# Patient Record
Sex: Female | Born: 1995 | Race: Black or African American | Hispanic: No | Marital: Single | Smoking: Never smoker
Health system: Southern US, Community
[De-identification: ages and names within clinical notes are randomized; demographics above are authoritative.]

## PROBLEM LIST (undated history)

## (undated) DIAGNOSIS — E119 Type 2 diabetes mellitus without complications: Secondary | ICD-10-CM

## (undated) DIAGNOSIS — L409 Psoriasis, unspecified: Secondary | ICD-10-CM

## (undated) DIAGNOSIS — F32A Depression, unspecified: Secondary | ICD-10-CM

## (undated) HISTORY — PX: TONSILECTOMY, ADENOIDECTOMY, BILATERAL MYRINGOTOMY AND TUBES: SHX2538

## (undated) HISTORY — DX: Depression, unspecified: F32.A

---
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
## 2016-01-14 ENCOUNTER — Encounter (HOSPITAL_COMMUNITY): Payer: Self-pay | Admitting: *Deleted

## 2016-01-14 ENCOUNTER — Inpatient Hospital Stay (HOSPITAL_COMMUNITY)
Admission: AD | Admit: 2016-01-14 | Discharge: 2016-01-14 | Disposition: A | Discharge status: Home / Self Care | Payer: Medicaid Other | Source: Ambulatory Visit | Attending: Family Medicine | Admitting: Family Medicine

## 2016-01-14 DIAGNOSIS — N926 Irregular menstruation, unspecified: Secondary | ICD-10-CM | POA: Insufficient documentation

## 2016-01-14 LAB — URINALYSIS, ROUTINE W REFLEX MICROSCOPIC
Bilirubin Urine: NEGATIVE
Glucose, UA: NEGATIVE mg/dL
KETONES UR: NEGATIVE mg/dL
LEUKOCYTES UA: NEGATIVE
NITRITE: NEGATIVE
PROTEIN: NEGATIVE mg/dL
SPECIFIC GRAVITY, URINE: 1.025 (ref 1.005–1.030)
pH: 5.5 (ref 5.0–8.0)

## 2016-01-14 LAB — URINE MICROSCOPIC-ADD ON

## 2016-01-14 LAB — POCT PREGNANCY, URINE: PREG TEST UR: NEGATIVE

## 2016-01-14 NOTE — MAU Provider Note (Signed)
History     CSN: [id number]  Arrival date and time: 01/14/16 1950   First Provider Initiated Contact with Patient 01/14/16 2034      Chief Complaint  Patient presents with  . Menstrual Problem   Paula [name] is a 20 y.o. G0P0000 who presents today with irregular periods. She states that her periods have been irregular since 2014. She was started on birth control pills, but she did not take them. She states that they helped with the cramps, but did not help regulate her cycles. She denies any pain or bleeding today. She states that she has only had intercourse one time in her life when she was 77, and has not had intercourse since. She is not concerned about any STIs. She does not have a PCP. LMP she is unsure of the date. She states that she does not keep a calendar with her periods. She wants "to be checked for irregular periods."   Past Medical History:  Diagnosis Date  . Asthma     Past Surgical History:  Procedure Laterality Date  . GINGIVECTOMY  2010  . TONSILECTOMY, ADENOIDECTOMY, BILATERAL MYRINGOTOMY AND TUBES      Family History  Problem Relation Age of Onset  . Hypertension Maternal Grandmother     Social History  Substance Use Topics  . Smoking status: Never Smoker  . Smokeless tobacco: Never Used  . Alcohol use No    Allergies: No Known Allergies  Prescriptions Prior to Admission  Medication Sig Dispense Refill Last Dose  . carbamide peroxide (DEBROX) 6.5 % otic solution Place 5 drops into the right ear 2 (two) times daily. X 5 days 15 mL 1   . fluticasone (FLONASE) 50 MCG/ACT nasal spray Place 2 sprays into both nostrils daily. 16 g 0   . loratadine (CLARITIN) 10 MG tablet Take 1 tablet (10 mg total) by mouth daily. 30 tablet 0   . norgestimate-ethinyl estradiol (ORTHO-CYCLEN,SPRINTEC,PREVIFEM) 0.25-35 MG-MCG tablet Take 1 tablet by mouth daily. 1 Package 11     Review of Systems  Constitutional: Negative for chills and fever.  Gastrointestinal:  Positive for nausea ("a couple of weeks ago."). Negative for abdominal pain, constipation, diarrhea and vomiting.  Genitourinary: Negative for dysuria, frequency and urgency.   Physical Exam   Blood pressure 124/73, pulse 80, temperature 99.1 F (37.3 C), temperature source Oral, resp. rate 15, height 5\' 4"  (1.626 m), weight 185 lb (83.9 kg), last menstrual period 12/24/2015, SpO2 99 %.  Physical Exam  Nursing note and vitals reviewed. Constitutional: She appears well-developed and well-nourished. No distress.  HENT:  Head: Normocephalic.  Cardiovascular: Normal rate.   Respiratory: Effort normal.  GI: Soft. There is no tenderness. There is no rebound.  Neurological: She is alert.  Skin: Skin is warm and dry.  Psychiatric: She has a normal mood and affect.    MAU Course  Procedures  MDM D/W the patient at length that the only way to know what is happening with her periods it to keep a calendar of when she is having bleeding. Patient agreeable to keeping a calendar and then following up with GYN outpatient.   Assessment and Plan   1. Irregular menstrual cycle    DC home Comfort measures reviewed  Menstrual calendar In the absence of pain or bleeding today recommend outpatient follow up.  RX: none  Return to MAU as needed FU with OB as planned  Follow-up Information    [facility] .   Contact information:  [address] [place] [address] [phone]            Tawnya CrookHogan, Aiya Keach Donovan 01/14/2016, 8:37 PM

## 2016-01-14 NOTE — MAU Note (Signed)
Pt states  She wants to see if she can be tested for fibroids. States her periods are irregular and she has cramping with her periods.

## 2016-01-14 NOTE — Discharge Instructions (Signed)
° ° ° °  OBGYN providers [facility]    [facility]  [phone]     Phone: [phone]          [facility]                      [facility]  @[place]     Phone: [phone]  Phone: [phone]         [facility]     Phone: [phone]  Phone: [phone]           [facility] [place]                hone: [phone]  Phone: [phone]         [facility] [address] Francoise Ceo[facility]ard [facility]      Phone: [phone]  Phone: [phone]         [facility]                Phone: [phone]  [facility]   [address]Phone:[phone]    [facility] [place]          Phone: [phone] [facility]   Phone: [phone]

## 2016-03-03 ENCOUNTER — Encounter (HOSPITAL_COMMUNITY): Payer: Self-pay | Admitting: Emergency Medicine

## 2016-03-03 ENCOUNTER — Emergency Department (HOSPITAL_COMMUNITY)
Admission: EM | Admit: 2016-03-03 | Discharge: 2016-03-04 | Disposition: A | Discharge status: Home / Self Care | Payer: Medicaid Other | Attending: Emergency Medicine | Admitting: Emergency Medicine

## 2016-03-03 DIAGNOSIS — Y939 Activity, unspecified: Secondary | ICD-10-CM | POA: Insufficient documentation

## 2016-03-03 DIAGNOSIS — Y929 Unspecified place or not applicable: Secondary | ICD-10-CM | POA: Insufficient documentation

## 2016-03-03 DIAGNOSIS — H1132 Conjunctival hemorrhage, left eye: Secondary | ICD-10-CM | POA: Insufficient documentation

## 2016-03-03 DIAGNOSIS — S6992XA Unspecified injury of left wrist, hand and finger(s), initial encounter: Secondary | ICD-10-CM | POA: Diagnosis present

## 2016-03-03 DIAGNOSIS — Y999 Unspecified external cause status: Secondary | ICD-10-CM | POA: Insufficient documentation

## 2016-03-03 DIAGNOSIS — Z79899 Other long term (current) drug therapy: Secondary | ICD-10-CM | POA: Insufficient documentation

## 2016-03-03 DIAGNOSIS — Z23 Encounter for immunization: Secondary | ICD-10-CM | POA: Diagnosis not present

## 2016-03-03 DIAGNOSIS — J45909 Unspecified asthma, uncomplicated: Secondary | ICD-10-CM | POA: Insufficient documentation

## 2016-03-03 MED ORDER — FLUORESCEIN SODIUM 1 MG OP STRP
ORAL_STRIP | OPHTHALMIC | Status: AC
  Filled 2016-03-03: qty 1

## 2016-03-03 MED ORDER — TETRACAINE HCL 0.5 % OP SOLN
OPHTHALMIC | Status: AC
  Filled 2016-03-03: qty 4
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
## 2016-03-04 MED ORDER — TETANUS-DIPHTH-ACELL PERTUSSIS 5-2.5-18.5 LF-MCG/0.5 IM SUSP
0.5000 mL | Freq: Once | INTRAMUSCULAR | Status: AC
  Administered 2016-03-04: 0.5 mL via INTRAMUSCULAR
  Filled 2016-03-04: qty 0.5

## 2016-03-04 MED ORDER — FLUORESCEIN SODIUM 1 MG OP STRP
1.0000 | ORAL_STRIP | Freq: Once | OPHTHALMIC | Status: AC
  Administered 2016-03-04: 1 via OPHTHALMIC
  Filled 2016-03-04: qty 1

## 2016-03-04 MED ORDER — PROPARACAINE HCL 0.5 % OP SOLN
1.0000 [drp] | Freq: Once | OPHTHALMIC | Status: AC
  Administered 2016-03-04: 1 [drp] via OPHTHALMIC
  Filled 2016-03-04: qty 15

## 2016-03-04 MED ORDER — IBUPROFEN 600 MG PO TABS: 600.0000 mg | ORAL_TABLET | Freq: Four times a day (QID) | ORAL | 0 refills | Status: DC | PRN

## 2016-03-04 MED ORDER — BACITRACIN ZINC 500 UNIT/GM EX OINT
TOPICAL_OINTMENT | Freq: Two times a day (BID) | CUTANEOUS | Status: DC
  Administered 2016-03-04: 1 via TOPICAL
  Filled 2016-03-04: qty 0.9

## 2016-03-04 NOTE — ED Notes (Signed)
Patient was alert, oriented and stable upon discharge. RN went over AVS and patient had no further questions.  

## 2016-03-21 ENCOUNTER — Ambulatory Visit: Payer: Medicaid Other | Admitting: Obstetrics & Gynecology

## 2016-03-25 ENCOUNTER — Encounter (HOSPITAL_COMMUNITY): Payer: Self-pay | Admitting: Emergency Medicine

## 2016-03-25 ENCOUNTER — Emergency Department (HOSPITAL_COMMUNITY): Payer: Medicaid Other

## 2016-03-25 ENCOUNTER — Emergency Department (HOSPITAL_COMMUNITY)
Admission: EM | Admit: 2016-03-25 | Discharge: 2016-03-25 | Disposition: A | Discharge status: Home / Self Care | Payer: Medicaid Other | Attending: Emergency Medicine | Admitting: Emergency Medicine

## 2016-03-25 DIAGNOSIS — Y92410 Unspecified street and highway as the place of occurrence of the external cause: Secondary | ICD-10-CM | POA: Insufficient documentation

## 2016-03-25 DIAGNOSIS — Y999 Unspecified external cause status: Secondary | ICD-10-CM | POA: Insufficient documentation

## 2016-03-25 DIAGNOSIS — Y939 Activity, unspecified: Secondary | ICD-10-CM | POA: Insufficient documentation

## 2016-03-25 DIAGNOSIS — M25521 Pain in right elbow: Secondary | ICD-10-CM | POA: Insufficient documentation

## 2016-03-25 DIAGNOSIS — J45909 Unspecified asthma, uncomplicated: Secondary | ICD-10-CM | POA: Insufficient documentation

## 2016-03-25 DIAGNOSIS — S3992XA Unspecified injury of lower back, initial encounter: Secondary | ICD-10-CM | POA: Insufficient documentation

## 2016-03-25 LAB — POC URINE PREG, ED: Preg Test, Ur: NEGATIVE

## 2016-03-25 MED ORDER — IBUPROFEN 400 MG PO TABS
800.0000 mg | ORAL_TABLET | Freq: Once | ORAL | Status: AC
  Administered 2016-03-25: 800 mg via ORAL
  Filled 2016-03-25: qty 2

## 2016-03-25 NOTE — ED Notes (Signed)
Pt kept asking to wait in lobby and receive results there. Pt informed she would need to wait in her room. Pt left the room and hasn't been back for 30 minutes.
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
## 2016-06-03 ENCOUNTER — Ambulatory Visit (INDEPENDENT_AMBULATORY_CARE_PROVIDER_SITE_OTHER): Payer: Medicaid Other | Admitting: Obstetrics and Gynecology

## 2016-06-03 ENCOUNTER — Encounter: Payer: Self-pay | Admitting: Obstetrics and Gynecology

## 2016-06-03 VITALS — BP 103/68 | HR 73 | Wt 185.1 lb

## 2016-06-03 DIAGNOSIS — N920 Excessive and frequent menstruation with regular cycle: Secondary | ICD-10-CM

## 2016-06-03 LAB — CBC
HEMATOCRIT: 36.2 % (ref 35.0–45.0)
HEMOGLOBIN: 12 g/dL (ref 11.7–15.5)
MCH: 27.8 pg (ref 27.0–33.0)
MCHC: 33.1 g/dL (ref 32.0–36.0)
MCV: 84 fL (ref 80.0–100.0)
MPV: 9.5 fL (ref 7.5–12.5)
Platelets: 303 10*3/uL (ref 140–400)
RBC: 4.31 MIL/uL (ref 3.80–5.10)
RDW: 15.4 % — ABNORMAL HIGH (ref 11.0–15.0)
WBC: 6.5 10*3/uL (ref 3.8–10.8)

## 2016-06-03 MED ORDER — NORETHIN ACE-ETH ESTRAD-FE 1-20 MG-MCG(24) PO TABS: 1.0000 | ORAL_TABLET | Freq: Every day | ORAL | 11 refills | Status: DC

## 2016-06-03 NOTE — Patient Instructions (Signed)
Oral Contraception Information Oral contraceptive pills (OCPs) are medicines taken to prevent pregnancy. OCPs work by preventing the ovaries from releasing eggs. The hormones in OCPs also cause the cervical mucus to thicken, preventing the sperm from entering the uterus. The hormones also cause the uterine lining to become thin, not allowing a fertilized egg to attach to the inside of the uterus. OCPs are highly effective when taken exactly as prescribed. However, OCPs do not prevent sexually transmitted diseases (STDs). Safe sex practices, such as using condoms along with the pill, can help prevent STDs.  Before taking the pill, you may have a physical exam and Pap test. Your health care provider may order blood tests. The health care provider will make sure you are a good candidate for oral contraception. Discuss with your health care provider the possible side effects of the OCP you may be prescribed. When starting an OCP, it can take 2 to 3 months for the body to adjust to the changes in hormone levels in your body.  TYPES OF ORAL CONTRACEPTION  The combination pill-This pill contains estrogen and progestin (synthetic progesterone) hormones. The combination pill comes in 21-day, 28-day, or 91-day packs. Some types of combination pills are meant to be taken continuously (365-day pills). With 21-day packs, you do not take pills for 7 days after the last pill. With 28-day packs, the pill is taken every day. The last 7 pills are without hormones. Certain types of pills have more than 21 hormone-containing pills. With 91-day packs, the first 84 pills contain both hormones, and the last 7 pills contain no hormones or contain estrogen only.  The minipill-This pill contains the progesterone hormone only. The pill is taken every day continuously. It is very important to take the pill at the same time each day. The minipill comes in packs of 28 pills. All 28 pills contain the hormone.  ADVANTAGES OF ORAL  CONTRACEPTIVE PILLS  Decreases premenstrual symptoms.   Treats menstrual period cramps.   Regulates the menstrual cycle.   Decreases a heavy menstrual flow.   May treatacne, depending on the type of pill.   Treats abnormal uterine bleeding.   Treats polycystic ovarian syndrome.   Treats endometriosis.   Can be used as emergency contraception.  THINGS THAT CAN MAKE ORAL CONTRACEPTIVE PILLS LESS EFFECTIVE OCPs can be less effective if:   You forget to take the pill at the same time every day.   You have a stomach or intestinal disease that lessens the absorption of the pill.   You take OCPs with other medicines that make OCPs less effective, such as antibiotics, certain HIV medicines, and some seizure medicines.   You take expired OCPs.   You forget to restart the pill on day 7, when using the packs of 21 pills.  RISKS ASSOCIATED WITH ORAL CONTRACEPTIVE PILLS  Oral contraceptive pills can sometimes cause side effects, such as:  Headache.  Nausea.  Breast tenderness.  Irregular bleeding or spotting. Combination pills are also associated with a small increased risk of:  Blood clots.  Heart attack.  Stroke. This information is not intended to replace advice given to you by your health care provider. Make sure you discuss any questions you have with your health care provider. Document Released: 07/12/2002 Document Revised: 08/13/2015 Document Reviewed: 10/10/2012 Elsevier Interactive Patient Education  2017 Elsevier Inc.  

## 2016-06-03 NOTE — Progress Notes (Signed)
   Subjective:    Patient ID: Paula [name], female    DOB: [birth year], 21 y.o.   MRN: [id number]  Paula DunningsZenkiah M Mifflin is a 21 y.o. G0P0 presenting with heavy menstrual periods. Menarche age 21 and flow has always been heavy. She states she bleeds heavily and passes clots for the first few days of her 7 day flow. Interval is regular at q4 weeks. LMP 05/18/2016. No intermenstrual bleeding or spotting.  In the past she has used Sprintec--most recently in 2015, however she felt it caused menses to be heavier and did not continue. She is concerned (after checking the Internet) that she has an endocrine problem affecting her menstrual flow because she has noticed some hair on her chin and face. She is not sexually active and states she had intercourse only once 3 years ago. Denies irritative vaginal discharge. Denies fatigue or orthostatic symptoms.      Review of Systems  Constitutional: Negative for fatigue and fever.  Gastrointestinal: Negative for nausea and vomiting.  Genitourinary: Negative for dysuria.  Neurological: Negative for headaches.  Psychiatric/Behavioral: The patient is not nervous/anxious.        Objective:   Physical Exam  Constitutional: She is oriented to person, place, and time. She appears well-developed and well-nourished. No distress.  HENT:  Head: Normocephalic.  Neck: Normal range of motion.  Cardiovascular: Normal rate.   Pulmonary/Chest: Effort normal.  Abdominal: Soft. There is no tenderness.  Genitourinary:  Genitourinary Comments: Did not tolerate speculum exam. One finger digital exam revealed no CMT. Uterus poorly outlined due to tensed muscles, but no tenderness or masses noted in uterus or adnexae  Musculoskeletal: Normal range of motion.  Neurological: She is alert and oriented to person, place, and time.  Skin: Skin is warm and dry.  No apparent hirsutism  Nursing note and vitals reviewed.  CBC sent       Assessment & Plan:   1. Menorrhagia with  regular cycle    Rx Loestrin with instructions Home with reassurance.  Follow up 3 months

## 2017-02-05 ENCOUNTER — Encounter: Payer: Self-pay | Admitting: Emergency Medicine

## 2017-02-05 ENCOUNTER — Emergency Department (HOSPITAL_COMMUNITY)
Admission: EM | Admit: 2017-02-05 | Discharge: 2017-02-05 | Disposition: A | Discharge status: Home / Self Care | Payer: Medicaid Other | Attending: Emergency Medicine | Admitting: Emergency Medicine

## 2017-02-05 DIAGNOSIS — Z79899 Other long term (current) drug therapy: Secondary | ICD-10-CM | POA: Insufficient documentation

## 2017-02-05 DIAGNOSIS — R51 Headache: Secondary | ICD-10-CM | POA: Diagnosis present

## 2017-02-05 DIAGNOSIS — N898 Other specified noninflammatory disorders of vagina: Secondary | ICD-10-CM | POA: Diagnosis not present

## 2017-02-05 DIAGNOSIS — R0981 Nasal congestion: Secondary | ICD-10-CM | POA: Diagnosis not present

## 2017-02-05 LAB — WET PREP, GENITAL
CLUE CELLS WET PREP: NONE SEEN
SPERM: NONE SEEN
TRICH WET PREP: NONE SEEN
YEAST WET PREP: NONE SEEN

## 2017-02-05 MED ORDER — SALINE SPRAY 0.65 % NA SOLN: 1.0000 | NASAL | 0 refills | Status: DC | PRN

## 2017-02-05 MED ORDER — LIDOCAINE HCL 1 % IJ SOLN
INTRAMUSCULAR | Status: AC
  Administered 2017-02-05: 1.5 mL
  Filled 2017-02-05: qty 20

## 2017-02-05 MED ORDER — AZITHROMYCIN 250 MG PO TABS
1000.0000 mg | ORAL_TABLET | Freq: Once | ORAL | Status: AC
  Administered 2017-02-05: 1000 mg via ORAL
  Filled 2017-02-05: qty 4

## 2017-02-05 MED ORDER — CEFTRIAXONE SODIUM 250 MG IJ SOLR
250.0000 mg | Freq: Once | INTRAMUSCULAR | Status: AC
  Administered 2017-02-05: 250 mg via INTRAMUSCULAR
  Filled 2017-02-05: qty 250

## 2017-02-05 MED ORDER — PSEUDOEPHEDRINE HCL 30 MG PO TABS: 30.0000 mg | ORAL_TABLET | Freq: Four times a day (QID) | ORAL | 0 refills | Status: DC | PRN

## 2017-02-05 NOTE — Discharge Instructions (Signed)
If any of your test come back positive someone will call you.  Take the medication as directed for your sinus congestion. Follow up with the health department.  Return here as needed.

## 2017-02-05 NOTE — ED Triage Notes (Signed)
Pt c/o facial and sinus pain. Pt also c/o vaginal discharge.

## 2017-02-05 NOTE — ED Notes (Signed)
Patient called to be roomed, no answer. Patient came back to registration, went to call patient again, pt was not in lobby or outside

## 2017-02-05 NOTE — ED Notes (Signed)
Bed: WTR7 Expected date:  Expected time:  Means of arrival:  Comments: 

## 2017-02-05 NOTE — ED Provider Notes (Signed)
[facility]-EMERGENCY DEPT Provider Note   CSN: [id number] Arrival date & time: 02/05/17  1014     History   Chief Complaint Chief Complaint  Patient presents with  . Facial Pain  . Vaginal Discharge    HPI Paula [name] is a 21 y.o. female who presents to the ED with sinus pressure, facial pain, cough and congeston and feeling like she wants to spit all the time. Denies sore throat or difficulty swallowing.  The symptoms started 3 days ago and come and go.   Patient also c/o vaginal d/c that started a few weeks ago and a lesion to the left upper thigh. Patient reports she is taking OC's but has missed pills. Last sexual intercourse 10 days ago, unprotected. No hx of STD's. Patient concerned that she may have an STI and would like to be treated just in case.  The history is provided by the patient. No language interpreter was used.  Vaginal Discharge   This is a new problem. The current episode started more than 1 week ago. The problem has not changed since onset.Pregnant now: unsure. Associated symptoms include frequency. Pertinent negatives include no fever, no abdominal pain, no diarrhea, no nausea, no vomiting and no dysuria.    Past Medical History:  Diagnosis Date  . Asthma     Patient Active Problem List   Diagnosis Date Noted  . Menometrorrhagia 01/19/2014  . General counseling for prescription of oral contraceptives 01/19/2014  . MRSA (methicillin resistant staph aureus) culture positive 09/03/2011  . Candida albicans infection 09/03/2011    Past Surgical History:  Procedure Laterality Date  . GINGIVECTOMY  2010  . TONSILECTOMY, ADENOIDECTOMY, BILATERAL MYRINGOTOMY AND TUBES      OB History    Gravida Para Term Preterm AB Living   0 0 0 0 0 0   SAB TAB Ectopic Multiple Live Births   0 0 0 0         Home Medications    Prior to Admission medications   Medication Sig Start Date End Date Taking? Authorizing Provider  griseofulvin (GRIS-PEG) 250 MG tablet Take  500 mg by mouth daily. 01/06/17  Yes [provider]  ibuprofen (ADVIL,MOTRIN) 200 MG tablet Take 400 mg by mouth every 6 (six) hours as needed for moderate pain.   Yes [provider]  loratadine (CLARITIN) 10 MG tablet Take 1 tablet (10 mg total) by mouth daily. 12/17/14  Yes Street, [place], PA-C  Norethindrone Acetate-Ethinyl Estrad-FE (LOESTRIN 24 FE) 1-20 MG-MCG(24) tablet Take 1 tablet by mouth daily. 06/03/16  Yes Poe, Deirdre C, CNM  norgestimate-ethinyl estradiol (ORTHO-CYCLEN,SPRINTEC,PREVIFEM) 0.25-35 MG-MCG tablet Take 1 tablet by mouth daily. Patient not taking: Reported on 06/03/2016 01/19/14   Glyn Ade, Scot Jun, PA-C  pseudoephedrine (SUDAFED) 30 MG tablet Take 1 tablet (30 mg total) by mouth every 6 (six) hours as needed for congestion. 02/05/17   Janne Napoleon, NP  sodium chloride (OCEAN) 0.65 % SOLN nasal spray Place 1 spray into both nostrils as needed for congestion. 02/05/17   Janne Napoleon, NP    Family History Family History  Problem Relation Age of Onset  . Hypertension Maternal Grandmother     Social History Social History  Substance Use Topics  . Smoking status: Never Smoker  . Smokeless tobacco: Never Used  . Alcohol use No     Allergies   Patient has no known allergies.   Review of Systems Review of Systems  Constitutional: Negative for chills and fever.  HENT: Positive for congestion, sinus pain, sinus pressure, sneezing and sore throat. Negative for ear pain, facial swelling and trouble swallowing.   Eyes: Negative for discharge, redness and itching.  Respiratory: Negative for chest tightness and shortness of breath.   Cardiovascular: Negative for chest pain and leg swelling.  Gastrointestinal: Negative for abdominal pain, diarrhea, nausea and vomiting.  Genitourinary: Positive for frequency and vaginal discharge. Negative for dysuria, pelvic pain and vaginal pain.  Musculoskeletal: Negative for myalgias and neck pain.  Skin:  Positive for rash.  Neurological: Positive for headaches. Negative for syncope, weakness and light-headedness.  Psychiatric/Behavioral: Negative for confusion. The patient is not nervous/anxious.      Physical Exam Updated Vital Signs BP 122/74   Pulse (!) 118   Temp 98.7 F (37.1 C) (Oral)   Resp 16   Ht  (1.626 m)   Wt 81.6 kg (180 lb)   LMP 01/11/2017 (Approximate)   SpO2 100%   BMI 30.90 kg/m   Physical Exam  Constitutional: She appears well-developed and well-nourished. No distress.  HENT:  Head: Normocephalic and atraumatic.  Right Ear: Tympanic membrane normal.  Left Ear: Tympanic membrane normal.  Nose: Rhinorrhea present. Right sinus exhibits maxillary sinus tenderness. Left sinus exhibits maxillary sinus tenderness.  Mouth/Throat: Mucous membranes are normal. No oral lesions. No trismus in the jaw. Normal dentition. No uvula swelling or dental caries. No posterior oropharyngeal edema or posterior oropharyngeal erythema.  Face if symmetric, no decreased sensation.   Eyes: Pupils are equal, round, and reactive to light. Conjunctivae and EOM are normal.  Neck: Normal range of motion. Neck supple.  Cardiovascular: Normal rate and regular rhythm.   Pulmonary/Chest: Effort normal and breath sounds normal. She exhibits no tenderness.  Abdominal: Soft. Bowel sounds are normal. There is no tenderness.  Genitourinary:  Genitourinary Comments: External genitalia without lesions, watery gray d/c vaginal vault. Cultures collected for GC and Chlamydia.   Musculoskeletal: Normal range of motion.  Lymphadenopathy:    She has no cervical adenopathy.  Neurological: She is alert.  Skin: Skin is warm and dry.  2 areas of folliculitis to pubic area and one to left inner thigh.   Psychiatric: She has a normal mood and affect. Her behavior is normal.  Nursing note and vitals reviewed.    ED Treatments / Results  Labs (all labs ordered are listed, but only abnormal results are  displayed) Labs Reviewed  WET PREP, GENITAL - Abnormal; Notable for the following:       Result Value   WBC, Wet Prep HPF POC MODERATE (*)    All other components within normal limits  RPR  HIV ANTIBODY (ROUTINE TESTING)  GC/CHLAMYDIA PROBE AMP ([place]) NOT AT [facility]    Radiology No results found.  Procedures Procedures (including critical care time)  Medications Ordered in ED Medications  cefTRIAXone (ROCEPHIN) injection 250 mg (250 mg Intramuscular Given 02/05/17 1402)  azithromycin (ZITHROMAX) tablet 1,000 mg (1,000 mg Oral Given 02/05/17 1401)  lidocaine (XYLOCAINE) 1 % (with pres) injection (1.5 mLs  Given 02/05/17 1406)     Initial Impression / Assessment and Plan / ED Course  I have reviewed the triage vital signs and the nursing notes. Pt presents with concerns for possible STD.  Pt understands that they have GC/Chlamydia cultures pending and that they will need to inform all sexual partners if results return positive. Pt has been treated prophylactically with azithromycin and Rocephin due to pts history, pelvic exam, and wet prep with increased WBCs.  Pt not concerning for PID because hemodynamically stable and no cervical motion tenderness on pelvic exam.  Patient to be discharged with instructions to follow up with [facility]. Discussed importance of using protection when sexually active.   Patient also with sinus pressure and URI symptoms. Will treat with Sudafed and Normal saline nose spray. Patient to f/u with her PCP. Return precautions discussed.   Final Clinical Impressions(s) / ED Diagnoses   Final diagnoses:  Vaginal discharge  Sinus congestion    New Prescriptions Discharge Medication List as of 02/05/2017  3:59 PM    START taking these medications   Details  pseudoephedrine (SUDAFED) 30 MG tablet Take 1 tablet (30 mg total) by mouth every 6 (six) hours as needed for congestion., Starting Thu 02/05/2017, Print    sodium chloride (OCEAN) 0.65 % SOLN nasal spray  Place 1 spray into both nostrils as needed for congestion., Starting Thu 02/05/2017, [address] [place], NP 02/05/17 Herminio Commons    Benjiman Core, MD 02/06/17 [phone]

## 2017-02-06 LAB — GC/CHLAMYDIA PROBE AMP (~~LOC~~) NOT AT ARMC
Chlamydia: NEGATIVE
NEISSERIA GONORRHEA: NEGATIVE

## 2017-02-06 LAB — RPR: RPR Ser Ql: NONREACTIVE

## 2017-02-06 LAB — HIV ANTIBODY (ROUTINE TESTING W REFLEX): HIV Screen 4th Generation wRfx: NONREACTIVE

## 2017-05-25 ENCOUNTER — Encounter (HOSPITAL_COMMUNITY): Payer: Self-pay

## 2017-05-25 ENCOUNTER — Inpatient Hospital Stay (HOSPITAL_COMMUNITY)
Admission: AD | Admit: 2017-05-25 | Discharge: 2017-05-25 | Disposition: A | Discharge status: Home / Self Care | Payer: Medicaid Other | Source: Ambulatory Visit | Attending: Obstetrics and Gynecology | Admitting: Obstetrics and Gynecology

## 2017-05-25 ENCOUNTER — Emergency Department (HOSPITAL_COMMUNITY)
Admission: EM | Admit: 2017-05-25 | Discharge: 2017-05-25 | Discharge status: Against Medical Advice | Payer: No Typology Code available for payment source

## 2017-05-25 DIAGNOSIS — J45909 Unspecified asthma, uncomplicated: Secondary | ICD-10-CM | POA: Insufficient documentation

## 2017-05-25 DIAGNOSIS — K117 Disturbances of salivary secretion: Secondary | ICD-10-CM | POA: Diagnosis not present

## 2017-05-25 DIAGNOSIS — K119 Disease of salivary gland, unspecified: Secondary | ICD-10-CM | POA: Insufficient documentation

## 2017-05-25 DIAGNOSIS — R300 Dysuria: Secondary | ICD-10-CM | POA: Insufficient documentation

## 2017-05-25 DIAGNOSIS — E860 Dehydration: Secondary | ICD-10-CM | POA: Insufficient documentation

## 2017-05-25 LAB — URINALYSIS, ROUTINE W REFLEX MICROSCOPIC
Bilirubin Urine: NEGATIVE
Glucose, UA: NEGATIVE mg/dL
Hgb urine dipstick: NEGATIVE
Ketones, ur: NEGATIVE mg/dL
Leukocytes, UA: NEGATIVE
Nitrite: NEGATIVE
Protein, ur: NEGATIVE mg/dL
Specific Gravity, Urine: 1.026 (ref 1.005–1.030)
pH: 6 (ref 5.0–8.0)

## 2017-05-25 LAB — POCT PREGNANCY, URINE: PREG TEST UR: NEGATIVE

## 2017-05-25 NOTE — MAU Provider Note (Signed)
Chief Complaint: Dehydration and Dysuria   First Provider Initiated Contact with Patient 05/25/17 2217      SUBJECTIVE HPI: Paula [name] is a 22 y.o. G0P0000 not currently pregnant who presents to maternity admissions reporting dehydration and dysuria. Pt was at [facility] but left prior to being seen d/t the wait being too long. She complains of increased urination for the past two days. She reports burning with urination one time during these two days. She states she is dehydration and has looked it up on google because "she can not spit and if she wanted to spit on someone she couldn't". She denies vaginal discharge or bleeding. She denies vaginal itching/burning, h/a, dizziness, n/v, or fever/chills.      Past Medical History:  Diagnosis Date  . Asthma    Past Surgical History:  Procedure Laterality Date  . GINGIVECTOMY  2010  . TONSILECTOMY, ADENOIDECTOMY, BILATERAL MYRINGOTOMY AND TUBES     Social History   Socioeconomic History  . Marital status: Single    Spouse name: Not on file  . Number of children: Not on file  . Years of education: Not on file  . Highest education level: Not on file  Social Needs  . Financial resource strain: Not on file  . Food insecurity - worry: Not on file  . Food insecurity - inability: Not on file  . Transportation needs - medical: Not on file  . Transportation needs - non-medical: Not on file  Occupational History  . Not on file  Tobacco Use  . Smoking status: Never Smoker  . Smokeless tobacco: Never Used  Substance and Sexual Activity  . Alcohol use: No  . Drug use: No  . Sexual activity: No  Other Topics Concern  . Not on file  Social History Narrative  . Not on file   No current facility-administered medications on file prior to encounter.    Current Outpatient Medications on File Prior to Encounter  Medication Sig Dispense Refill  . griseofulvin (GRIS-PEG) 250 MG tablet Take 500 mg by mouth daily.  0  . ibuprofen  (ADVIL,MOTRIN) 200 MG tablet Take 400 mg by mouth every 6 (six) hours as needed for moderate pain.    Marland Kitchen loratadine (CLARITIN) 10 MG tablet Take 1 tablet (10 mg total) by mouth daily. 30 tablet 0  . Norethindrone Acetate-Ethinyl Estrad-FE (LOESTRIN 24 FE) 1-20 MG-MCG(24) tablet Take 1 tablet by mouth daily. 1 Package 11  . norgestimate-ethinyl estradiol (ORTHO-CYCLEN,SPRINTEC,PREVIFEM) 0.25-35 MG-MCG tablet Take 1 tablet by mouth daily. (Patient not taking: Reported on 06/03/2016) 1 Package 11  . pseudoephedrine (SUDAFED) 30 MG tablet Take 1 tablet (30 mg total) by mouth every 6 (six) hours as needed for congestion. 20 tablet 0  . sodium chloride (OCEAN) 0.65 % SOLN nasal spray Place 1 spray into both nostrils as needed for congestion. 30 mL 0   No Known Allergies  ROS:  Review of Systems  Constitutional: Negative.   Respiratory: Negative.   Cardiovascular: Negative.   Gastrointestinal: Negative.   Genitourinary: Positive for dysuria and frequency. Negative for decreased urine volume, difficulty urinating, flank pain, hematuria, pelvic pain, urgency, vaginal bleeding, vaginal discharge and vaginal pain.  Musculoskeletal: Negative.   Neurological: Negative.   Psychiatric/Behavioral: Negative.    I have reviewed patient's Past Medical Hx, Surgical Hx, Family Hx, Social Hx, medications and allergies.   Physical Exam   Patient Vitals for the past 24 hrs:  BP Temp Temp src Pulse Resp Height  05/25/17 2207 118/81  98.8 F (37.1 C) Oral 88 16 5\' 3"  (1.6 m)   Constitutional: Well-developed, well-nourished female in no acute distress.  Cardiovascular: normal rate Respiratory: normal effort GI: Abd soft, non-tender. Pos BS x 4 MS: Extremities nontender, no edema, normal ROM Neurologic: Alert and oriented x 4.  GU: Neg CVAT. Pelvic exam: deferred   LAB RESULTS Results for orders placed or performed during the hospital encounter of 05/25/17 (from the past 24 hour(s))  Urinalysis, Routine w  reflex microscopic     Status: None   Collection Time: 05/25/17  9:57 PM  Result Value Ref Range   Color, Urine YELLOW YELLOW   APPearance CLEAR CLEAR   Specific Gravity, Urine 1.026 1.005 - 1.030   pH 6.0 5.0 - 8.0   Glucose, UA NEGATIVE NEGATIVE mg/dL   Hgb urine dipstick NEGATIVE NEGATIVE   Bilirubin Urine NEGATIVE NEGATIVE   Ketones, ur NEGATIVE NEGATIVE mg/dL   Protein, ur NEGATIVE NEGATIVE mg/dL   Nitrite NEGATIVE NEGATIVE   Leukocytes, UA NEGATIVE NEGATIVE  Pregnancy, urine POC     Status: None   Collection Time: 05/25/17 10:16 PM  Result Value Ref Range   Preg Test, Ur NEGATIVE NEGATIVE    MAU Management/MDM: Orders Placed This Encounter  Procedures  . Urinalysis, Routine w reflex microscopic  . Pregnancy, urine POC   UA-neg, no evidence of UTI  UPT- neg  Pt discharged with instructions to seek care at PCP or urgent care if symptoms do not resolve by the end of the week.   ASSESSMENT 1. Saliva decreased   2.      Dysuria   PLAN Discharge home Follow up at PCP or urgent care is symptoms worsen and do not resolve    Allergies as of 05/25/2017   No Known Allergies     Medication List    TAKE these medications   griseofulvin 250 MG tablet Commonly known as:  GRIS-PEG Take 500 mg by mouth daily.   ibuprofen 200 MG tablet Commonly known as:  ADVIL,MOTRIN Take 400 mg by mouth every 6 (six) hours as needed for moderate pain.   loratadine 10 MG tablet Commonly known as:  CLARITIN Take 1 tablet (10 mg total) by mouth daily.   Norethindrone Acetate-Ethinyl Estrad-FE 1-20 MG-MCG(24) tablet Commonly known as:  LOESTRIN 24 FE Take 1 tablet by mouth daily.   norgestimate-ethinyl estradiol 0.25-35 MG-MCG tablet Commonly known as:  ORTHO-CYCLEN,SPRINTEC,PREVIFEM Take 1 tablet by mouth daily.   pseudoephedrine 30 MG tablet Commonly known as:  SUDAFED Take 1 tablet (30 mg total) by mouth every 6 (six) hours as needed for congestion.   sodium chloride 0.65 %  Soln nasal spray Commonly known as:  OCEAN Place 1 spray into both nostrils as needed for congestion.        Steward DroneVeronica Donasia Wimes  Certified Nurse-Midwife 05/25/2017  10:21 PM

## 2017-05-25 NOTE — Discharge Instructions (Signed)
In late 2019, the [facility] will be moving to the [place] [facility]. At that time, the MAU (Maternity Admissions Unit), where you are being seen today, will no longer take care of non-pregnant patients. We strongly encourage you to find a doctor's office before that time, so that you can be seen with any GYN concerns, like vaginal discharge, urinary tract infection, etc.. in a timely manner. ° °In order to make an office visit more convenient, the [facility] will be offering evening hours with same-day appointments, walk-in appointments and scheduled appointments available during this time. ° °[facility] Hours: °Monday - 8am - 7:30 pm with walk-in between 4pm- 7:30 pm °Tuesday - 8 am - 5 pm (starting 08/04/17 we will be open late and accepting walk-ins from 4pm - 7:30pm) °Wednesday - 8 am - 5 pm (starting 11/04/17 we will be open late and accepting walk-ins from 4pm - 7:30pm) °Thursday 8 am - 5 pm (starting 02/04/18 we will be open late and accepting walk-ins from 4pm - 7:30pm) °Friday 8 am - 5 pm ° °For an appointment please call the [facility] at [phone] ° °For urgent needs, [facility] is also available for management of urgent GYN complaints such as vaginal discharge or urinary tract infections. ° ° ° ° ° °

## 2017-05-25 NOTE — ED Notes (Signed)
Pt called from the lobby with no repsonse 

## 2017-05-25 NOTE — MAU Note (Signed)
Feels like she's dehydrated since Tues or Wednesday of last week. Can urination but feels like she's retaining. Also has had some runny to soft stools. Might be pregnant.
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
## 2017-07-16 ENCOUNTER — Ambulatory Visit (HOSPITAL_COMMUNITY)
Admission: EM | Admit: 2017-07-16 | Discharge: 2017-07-16 | Discharge status: Against Medical Advice | Payer: Medicaid Other

## 2017-07-16 NOTE — ED Notes (Signed)
Called twice with no answer.

## 2017-08-28 ENCOUNTER — Other Ambulatory Visit: Payer: Self-pay | Admitting: Obstetrics and Gynecology

## 2017-08-28 DIAGNOSIS — N920 Excessive and frequent menstruation with regular cycle: Secondary | ICD-10-CM

## 2017-08-29 NOTE — Telephone Encounter (Signed)
Needs appt before more refills are given

## 2017-09-21 ENCOUNTER — Other Ambulatory Visit: Payer: Self-pay | Admitting: Medical

## 2017-09-21 DIAGNOSIS — N920 Excessive and frequent menstruation with regular cycle: Secondary | ICD-10-CM

## 2017-09-23 ENCOUNTER — Other Ambulatory Visit: Payer: Self-pay | Admitting: Medical

## 2017-09-23 DIAGNOSIS — N920 Excessive and frequent menstruation with regular cycle: Secondary | ICD-10-CM

## 2017-10-07 ENCOUNTER — Ambulatory Visit (INDEPENDENT_AMBULATORY_CARE_PROVIDER_SITE_OTHER): Payer: Medicaid Other | Admitting: Obstetrics & Gynecology

## 2017-10-07 ENCOUNTER — Encounter: Payer: Self-pay | Admitting: Obstetrics & Gynecology

## 2017-10-07 VITALS — BP 112/74 | HR 80 | Resp 16 | Wt 200.0 lb

## 2017-10-07 DIAGNOSIS — Z308 Encounter for other contraceptive management: Secondary | ICD-10-CM

## 2017-10-07 DIAGNOSIS — Z3202 Encounter for pregnancy test, result negative: Secondary | ICD-10-CM

## 2017-10-07 DIAGNOSIS — Z3009 Encounter for other general counseling and advice on contraception: Secondary | ICD-10-CM

## 2017-10-07 DIAGNOSIS — N921 Excessive and frequent menstruation with irregular cycle: Secondary | ICD-10-CM | POA: Diagnosis present

## 2017-10-07 LAB — POCT URINE PREGNANCY: Preg Test, Ur: NEGATIVE

## 2017-10-07 MED ORDER — NORGESTIMATE-ETH ESTRADIOL 0.25-35 MG-MCG PO TABS: 1.0000 | ORAL_TABLET | Freq: Every day | ORAL | 11 refills | Status: DC

## 2017-10-07 NOTE — Progress Notes (Signed)
   Subjective:    Patient ID: Paula [name], female    DOB: May 22, 1995, 22 y.o.   MRN: [id number]  HPI 22 yo single P0 here today to get a refill of OCPs. She likes her OCPs, remembers to take it. She ran out of OCPs last month and is using withdrawal.    Review of Systems  Coitarche at 22 yo, 1 partner still together Works at a call center    Objective:   Physical Exam  Breathing, conversing, and ambulating normally Well nourished, well hydrated Black female, no apparent distress Abd- benign     Assessment & Plan:  She thinks that she got Gardasil and will ask her mom to confirm this Preventative care- She is aware that she needs a pap but requests that we do it a different day I have refilled her OCPs. She will start OCPs with first day of NMP. Rec MVI daily Annual with fasting labs at next visit

## 2017-10-08 LAB — POCT PREGNANCY, URINE: PREG TEST UR: NEGATIVE

## 2017-10-11 ENCOUNTER — Encounter (HOSPITAL_COMMUNITY): Payer: Self-pay | Admitting: Nurse Practitioner

## 2017-10-11 ENCOUNTER — Emergency Department (HOSPITAL_COMMUNITY)
Admission: EM | Admit: 2017-10-11 | Discharge: 2017-10-11 | Disposition: A | Discharge status: Home / Self Care | Payer: Medicaid Other | Attending: Emergency Medicine | Admitting: Emergency Medicine

## 2017-10-11 DIAGNOSIS — Z79899 Other long term (current) drug therapy: Secondary | ICD-10-CM | POA: Diagnosis not present

## 2017-10-11 DIAGNOSIS — R102 Pelvic and perineal pain: Secondary | ICD-10-CM

## 2017-10-11 DIAGNOSIS — N3000 Acute cystitis without hematuria: Secondary | ICD-10-CM | POA: Diagnosis not present

## 2017-10-11 DIAGNOSIS — J45909 Unspecified asthma, uncomplicated: Secondary | ICD-10-CM | POA: Insufficient documentation

## 2017-10-11 LAB — URINALYSIS, ROUTINE W REFLEX MICROSCOPIC
Bilirubin Urine: NEGATIVE
Glucose, UA: NEGATIVE mg/dL
Ketones, ur: NEGATIVE mg/dL
NITRITE: NEGATIVE
Protein, ur: NEGATIVE mg/dL
SPECIFIC GRAVITY, URINE: 1.02 (ref 1.005–1.030)
pH: 6 (ref 5.0–8.0)

## 2017-10-11 LAB — WET PREP, GENITAL
CLUE CELLS WET PREP: NONE SEEN
Sperm: NONE SEEN
Trich, Wet Prep: NONE SEEN
YEAST WET PREP: NONE SEEN

## 2017-10-11 LAB — PREGNANCY, URINE: PREG TEST UR: NEGATIVE

## 2017-10-11 MED ORDER — CEPHALEXIN 500 MG PO CAPS: 500.0000 mg | ORAL_CAPSULE | Freq: Two times a day (BID) | ORAL | 0 refills | Status: AC

## 2017-10-11 NOTE — ED Provider Notes (Signed)
Emergency Department Provider Note   I have reviewed the triage vital signs and the nursing notes.   HISTORY  Chief Complaint Pelvic Pain   HPI Paula [name] is a 22 y.o. female recently treated for Chalmydia (last week) returns to the ED with concern for PID. She reports no pelvic pain or worsening discharge. No vaginal bleeding. No fever or chest/shoulder pain. Patient states that she did not have any symptoms with her Chlamydia diagnosis and has been reading online about PID so wanted to come in a get checked. No dysuria. No radiation of symptoms or modifying factors. Reports recent HIV testing.    Past Medical History:  Diagnosis Date  . Asthma     Patient Active Problem List   Diagnosis Date Noted  . Menometrorrhagia 01/19/2014  . General counseling for prescription of oral contraceptives 01/19/2014  . MRSA (methicillin resistant staph aureus) culture positive 09/03/2011  . Candida albicans infection 09/03/2011    Past Surgical History:  Procedure Laterality Date  . GINGIVECTOMY  2010  . TONSILECTOMY, ADENOIDECTOMY, BILATERAL MYRINGOTOMY AND TUBES      Current Outpatient Rx  . Order #: [id number] Class: Normal  . Order #: [id number] Class: Print  . Order #: [id number] Class: Historical Med  . Order #: [id number] Class: Print  . Order #: [id number] Class: Normal    Allergies Patient has no known allergies.  Family History  Problem Relation Age of Onset  . Hypertension Maternal Grandmother     Social History Social History   Tobacco Use  . Smoking status: Never Smoker  . Smokeless tobacco: Never Used  Substance Use Topics  . Alcohol use: No  . Drug use: No    Review of Systems  Constitutional: No fever/chills Eyes: No visual changes. ENT: No sore throat. Cardiovascular: Denies chest pain. Respiratory: Denies shortness of breath. Gastrointestinal: No abdominal pain.  No nausea, no vomiting.  No diarrhea.  No constipation. Genitourinary: Negative for  dysuria. Musculoskeletal: Negative for back pain. Skin: Negative for rash. Neurological: Negative for headaches, focal weakness or numbness.  10-point ROS otherwise negative.  ____________________________________________   PHYSICAL EXAM:  VITAL SIGNS: ED Triage Vitals  Enc Vitals Group     BP 10/11/17 1946 (!) 151/88     Pulse Rate 10/11/17 1946 100     Resp 10/11/17 1946 16     Temp 10/11/17 1946 98.4 F (36.9 C)     Temp Source 10/11/17 1946 Oral     SpO2 10/11/17 1946 97 %     Pain Score 10/11/17 1954 10   Constitutional: Alert and oriented. Well appearing and in no acute distress. Eyes: Conjunctivae are normal.  Head: Atraumatic. Nose: No congestion/rhinnorhea. Mouth/Throat: Mucous membranes are moist.  Oropharynx non-erythematous. Neck: No stridor. Cardiovascular: Good peripheral circulation.  Respiratory: Normal respiratory effort.   Gastrointestinal: No distention.  Genitourinary: Pelvic exam performed with nurse tech chaperone. Normal external genitalia. No CMT. Mild discharge. No adnexal tenderness or fullness.  Musculoskeletal: No lower extremity tenderness nor edema. No gross deformities of extremities. Neurologic:  Normal speech and language. No gross focal neurologic deficits are appreciated.  Skin:  Skin is warm, dry and intact. No rash noted.  ____________________________________________   LABS (all labs ordered are listed, but only abnormal results are displayed)  Labs Reviewed  WET PREP, GENITAL - Abnormal; Notable for the following components:      Result Value   WBC, Wet Prep HPF POC MANY (*)    All other components within normal limits  URINALYSIS, ROUTINE  W REFLEX MICROSCOPIC - Abnormal; Notable for the following components:   APPearance HAZY (*)    Hgb urine dipstick SMALL (*)    Leukocytes, UA MODERATE (*)    Bacteria, UA FEW (*)    All other components within normal limits  PREGNANCY, URINE  GC/CHLAMYDIA PROBE AMP ([place]) NOT AT  [facility]   ____________________________________________   PROCEDURES  Procedure(s) performed:   Procedures  None ____________________________________________   INITIAL IMPRESSION / ASSESSMENT AND PLAN / ED COURSE  Pertinent labs & imaging results that were available during my care of the patient were reviewed by me and considered in my medical decision making (see chart for details).  Patient presents to the ED with concern for PID after recent STD treatment. No symptoms at this time. Exam not concerning for PID or TOA. Patient does have evidence of UTI on UA. Provided Rx for Keflex and advised establishing care with PCP and OB/Gyn. Provided contact information.   At this time, I do not feel there is any life-threatening condition present. I have reviewed and discussed all results (EKG, imaging, lab, urine as appropriate), exam findings with patient. I have reviewed nursing notes and appropriate previous records.  I feel the patient is safe to be discharged home without further emergent workup. Discussed usual and customary return precautions. Patient and family (if present) verbalize understanding and are comfortable with this plan.  Patient will follow-up with their primary care provider. If they do not have a primary care provider, information for follow-up has been provided to them. All questions have been answered.  ____________________________________________  FINAL CLINICAL IMPRESSION(S) / ED DIAGNOSES  Final diagnoses:  Pelvic pain in female  Acute cystitis without hematuria    NEW OUTPATIENT MEDICATIONS STARTED DURING THIS VISIT:  Discharge Medication List as of 10/11/2017 10:38 PM    START taking these medications   Details  cephALEXin (KEFLEX) 500 MG capsule Take 1 capsule (500 mg total) by mouth 2 (two) times daily for 7 days., Starting Sun 10/11/2017, Until Sun 10/18/2017, Print        Note:  This document was prepared using Dragon voice recognition software and may  include unintentional dictation errors.  Alona BeneJoshua Long, MD Emergency Medicine    Long, Arlyss RepressJoshua G, MD 10/12/17 1030

## 2017-10-11 NOTE — ED Notes (Signed)
Topaz signature not working in room, Pt gave verbal

## 2017-10-11 NOTE — Discharge Instructions (Signed)

## 2017-10-11 NOTE — ED Triage Notes (Signed)
Pt states she [facility], she is requesting to be evaluated for PID as she is experiencing pelvic pain.

## 2017-10-13 ENCOUNTER — Encounter (HOSPITAL_COMMUNITY): Payer: Self-pay

## 2017-10-13 ENCOUNTER — Other Ambulatory Visit: Payer: Self-pay

## 2017-10-13 ENCOUNTER — Emergency Department (HOSPITAL_COMMUNITY)
Admission: EM | Admit: 2017-10-13 | Discharge: 2017-10-13 | Disposition: A | Discharge status: Home / Self Care | Payer: Medicaid Other | Attending: Emergency Medicine | Admitting: Emergency Medicine

## 2017-10-13 DIAGNOSIS — R102 Pelvic and perineal pain: Secondary | ICD-10-CM | POA: Insufficient documentation

## 2017-10-13 DIAGNOSIS — Z5321 Procedure and treatment not carried out due to patient leaving prior to being seen by health care provider: Secondary | ICD-10-CM | POA: Insufficient documentation

## 2017-10-13 LAB — URINALYSIS, ROUTINE W REFLEX MICROSCOPIC
Bilirubin Urine: NEGATIVE
GLUCOSE, UA: NEGATIVE mg/dL
HGB URINE DIPSTICK: NEGATIVE
Ketones, ur: NEGATIVE mg/dL
NITRITE: NEGATIVE
PH: 6 (ref 5.0–8.0)
Protein, ur: NEGATIVE mg/dL
SPECIFIC GRAVITY, URINE: 1.018 (ref 1.005–1.030)

## 2017-10-13 LAB — PREGNANCY, URINE: Preg Test, Ur: NEGATIVE

## 2017-10-13 NOTE — ED Triage Notes (Signed)
Pt states that she was recently seen for pelvic pain, and her symptoms have not improved. Pt states that she just picked up her rx yesterday from when she was seen.

## 2017-10-29 ENCOUNTER — Encounter: Payer: Self-pay | Admitting: Obstetrics & Gynecology

## 2017-11-02 ENCOUNTER — Emergency Department (HOSPITAL_COMMUNITY)
Admission: EM | Admit: 2017-11-02 | Discharge: 2017-11-02 | Disposition: A | Discharge status: Home / Self Care | Payer: Medicaid Other | Attending: Emergency Medicine | Admitting: Emergency Medicine

## 2017-11-02 ENCOUNTER — Other Ambulatory Visit: Payer: Self-pay

## 2017-11-02 ENCOUNTER — Encounter (HOSPITAL_COMMUNITY): Payer: Self-pay | Admitting: Emergency Medicine

## 2017-11-02 DIAGNOSIS — J45909 Unspecified asthma, uncomplicated: Secondary | ICD-10-CM | POA: Insufficient documentation

## 2017-11-02 DIAGNOSIS — N898 Other specified noninflammatory disorders of vagina: Secondary | ICD-10-CM | POA: Diagnosis present

## 2017-11-02 DIAGNOSIS — Z79899 Other long term (current) drug therapy: Secondary | ICD-10-CM | POA: Diagnosis not present

## 2017-11-02 HISTORY — DX: Type 2 diabetes mellitus without complications: E11.9

## 2017-11-02 LAB — URINALYSIS, ROUTINE W REFLEX MICROSCOPIC
BILIRUBIN URINE: NEGATIVE
GLUCOSE, UA: NEGATIVE mg/dL
HGB URINE DIPSTICK: NEGATIVE
KETONES UR: NEGATIVE mg/dL
Nitrite: NEGATIVE
PROTEIN: NEGATIVE mg/dL
Specific Gravity, Urine: 1.029 (ref 1.005–1.030)
pH: 5 (ref 5.0–8.0)

## 2017-11-02 LAB — I-STAT BETA HCG BLOOD, ED (MC, WL, AP ONLY): I-stat hCG, quantitative: <5 m[IU]/mL (ref <5)

## 2017-11-02 LAB — WET PREP, GENITAL
Clue Cells Wet Prep HPF POC: NONE SEEN
Sperm: NONE SEEN
TRICH WET PREP: NONE SEEN
Yeast Wet Prep HPF POC: NONE SEEN

## 2017-11-02 MED ORDER — LIDOCAINE HCL 1 % IJ SOLN
INTRAMUSCULAR | Status: AC
  Administered 2017-11-02: 20 mL
  Filled 2017-11-02: qty 20

## 2017-11-02 MED ORDER — CEFTRIAXONE SODIUM 250 MG IJ SOLR
250.0000 mg | Freq: Once | INTRAMUSCULAR | Status: AC
  Administered 2017-11-02: 250 mg via INTRAMUSCULAR
  Filled 2017-11-02: qty 250

## 2017-11-02 MED ORDER — AZITHROMYCIN 250 MG PO TABS
1000.0000 mg | ORAL_TABLET | Freq: Once | ORAL | Status: AC
  Administered 2017-11-02: 1000 mg via ORAL
  Filled 2017-11-02: qty 4

## 2017-11-02 NOTE — ED Notes (Signed)
Lt green and lav tube in lab if needed.

## 2017-11-02 NOTE — Discharge Instructions (Addendum)
You were seen in the emergency department today for vaginal discharge.  Your urine did not show an obvious UTI.  Your pregnancy test was negative. Your pelvic exam results did not show findings of a yeast infection.  We are testing you for gonorrhea, chlamydia, HIV, and syphilis, we will call you with any of the results are positive within the next 3 to 4 days.  We treated you for possible gonorrhea/chlamydia in the emergency department.  If these results return positive you will need to inform all sexual partners.  Please follow-up with your gynecologist as scheduled this Friday.  Return to the ER for new or worsening symptoms including but not limited to vomiting, abdominal pain, fever, or any other concerns.

## 2017-11-02 NOTE — ED Provider Notes (Signed)
[facility]-EMERGENCY DEPT Provider Note   CSN: [id number] Arrival date & time: 11/02/17  1928     History   Chief Complaint Chief Complaint  Patient presents with  . Vaginitis    itching with white discharge     HPI Paula [name] is a 22 y.o. female with a hx of DM and asthma who presents to the ED with complaint of vaginal discharge over past few days. Patient describes vaginal discharge as white, non pruritic, constant, no alleviating/aggravating factors. She thought this may be a yeast infection. She reports she takes diflucan for a fungal infection in her hair, she feels this may be a vaginal yeast infection with recent abx use for UTI (keflex), she had been taking this as prescribed, but did not take meds today due to coming to the ER for evaluation. No specific alleviating/aggravating factors. LMP within last 1 week, discharge started after period ended. Denies fever, chills, dysuria, abdominal pain, nausea, or vomiting. Patient is sexually active in a monogamous relationship, intermittent use of protection, no concern for STDs.   HPI  Past Medical History:  Diagnosis Date  . Asthma   . Diabetes mellitus without complication [facility])     Patient Active Problem List   Diagnosis Date Noted  . Menometrorrhagia 01/19/2014  . General counseling for prescription of oral contraceptives 01/19/2014  . MRSA (methicillin resistant staph aureus) culture positive 09/03/2011  . Candida albicans infection 09/03/2011    Past Surgical History:  Procedure Laterality Date  . GINGIVECTOMY  2010  . TONSILECTOMY, ADENOIDECTOMY, BILATERAL MYRINGOTOMY AND TUBES       OB History    Gravida  0   Para  0   Term  0   Preterm  0   AB  0   Living  0     SAB  0   TAB  0   Ectopic  0   Multiple  0   Live Births               Home Medications    Prior to Admission medications   Medication Sig Start Date End Date Taking? Authorizing Provider  BLISOVI  24 FE 1-20 MG-MCG(24) tablet TAKE 1 TABLET BY MOUTH DAILY. 08/29/17   Marny Lowenstein, PA-C  griseofulvin (GRIS-PEG) 250 MG tablet Take 500 mg by mouth daily. 01/06/17   [provider]  loratadine (CLARITIN) 10 MG tablet Take 1 tablet (10 mg total) by mouth daily. Patient not taking: Reported on 10/07/2017 12/17/14   [address], [place], [place]  norgestimate-ethinyl estradiol (ORTHO-CYCLEN,SPRINTEC,PREVIFEM) 0.25-35 MG-MCG tablet Take 1 tablet by mouth daily. 10/07/17   Allie Bossier, MD    Family History Family History  Problem Relation Age of Onset  . Hypertension Maternal Grandmother     Social History Social History   Tobacco Use  . Smoking status: Never Smoker  . Smokeless tobacco: Never Used  Substance Use Topics  . Alcohol use: No  . Drug use: No     Allergies   Patient has no known allergies.   Review of Systems Review of Systems  Constitutional: Negative for chills and fever.  Respiratory: Negative for shortness of breath.   Cardiovascular: Negative for chest pain.  Gastrointestinal: Negative for abdominal pain, constipation, diarrhea, nausea and vomiting.  Genitourinary: Positive for vaginal discharge. Negative for dysuria, flank pain, pelvic pain, urgency and vaginal bleeding.  All other systems reviewed and are negative.  Physical Exam Updated Vital Signs BP 122/73 (BP Location:  Left Arm)   Pulse 89   Temp 98.3 F (36.8 C) (Oral)   Resp 18   LMP 10/30/2017   SpO2 97%   Physical Exam  Constitutional: She appears well-developed and well-nourished. No distress.  HENT:  Head: Normocephalic and atraumatic.  Eyes: Conjunctivae are normal. Right eye exhibits no discharge. Left eye exhibits no discharge.  Cardiovascular: Normal rate and regular rhythm.  No murmur heard. Pulmonary/Chest: Breath sounds normal. No respiratory distress. She has no wheezes. She has no rales.  Abdominal: Soft. Bowel sounds are normal. She exhibits no distension. There is no  tenderness. There is no rebound and no guarding.  Genitourinary: Pelvic exam was performed with patient supine. There is no rash, tenderness or lesion on the right labia. There is no rash, tenderness or lesion on the left labia. Cervix exhibits discharge (yellow to white, thin). Cervix exhibits no motion tenderness and no friability. Right adnexum displays no mass, no tenderness and no fullness. Left adnexum displays no mass, no tenderness and no fullness. No erythema, tenderness or bleeding in the vagina. No foreign body in the vagina. No signs of injury around the vagina. Vaginal discharge (yellow to white, thin) found.  Genitourinary Comments: EDT present as chaperone.   Neurological: She is alert.  Clear speech.   Skin: Skin is warm and dry. No rash noted.  Psychiatric: She has a normal mood and affect. Her behavior is normal.  Nursing note and vitals reviewed.    ED Treatments / Results  Labs Results for orders placed or performed during the hospital encounter of 11/02/17  Wet prep, genital  Result Value Ref Range   Yeast Wet Prep HPF POC NONE SEEN NONE SEEN   Trich, Wet Prep NONE SEEN NONE SEEN   Clue Cells Wet Prep HPF POC NONE SEEN NONE SEEN   WBC, Wet Prep HPF POC MODERATE (A) NONE SEEN   Sperm NONE SEEN   Urinalysis, Routine w reflex microscopic  Result Value Ref Range   Color, Urine YELLOW YELLOW   APPearance HAZY (A) CLEAR   Specific Gravity, Urine 1.029 1.005 - 1.030   pH 5.0 5.0 - 8.0   Glucose, UA NEGATIVE NEGATIVE mg/dL   Hgb urine dipstick NEGATIVE NEGATIVE   Bilirubin Urine NEGATIVE NEGATIVE   Ketones, ur NEGATIVE NEGATIVE mg/dL   Protein, ur NEGATIVE NEGATIVE mg/dL   Nitrite NEGATIVE NEGATIVE   Leukocytes, UA TRACE (A) NEGATIVE   RBC / HPF 0-5 0 - 5 RBC/hpf   WBC, UA 6-10 0 - 5 WBC/hpf   Bacteria, UA RARE (A) NONE SEEN   Squamous Epithelial / LPF 6-10 0 - 5   Mucus PRESENT   I-Stat beta hCG blood, ED  Result Value Ref Range   I-stat hCG, quantitative <5.0  <5 mIU/mL   Comment 3           No results found. EKG None  Radiology No results found.  Procedures Procedures (including critical care time)  Medications Ordered in ED Medications  cefTRIAXone (ROCEPHIN) injection 250 mg (250 mg Intramuscular Given 11/02/17 2328)  azithromycin (ZITHROMAX) tablet 1,000 mg (1,000 mg Oral Given 11/02/17 2328)  lidocaine (XYLOCAINE) 1 % (with pres) injection (20 mLs  Given 11/02/17 2329)   Initial Impression / Assessment and Plan / ED Course  I have reviewed the triage vital signs and the nursing notes.  Pertinent labs & imaging results that were available during my care of the patient were reviewed by me and considered in my medical decision making (  see chart for details).   Patient presents to the emergency department with complaint of vaginal discharge.  Exam as above.  UA does not appear consistent with obvious UTI, patient without urinary symptoms.  Pregnancy test negative.  Wet prep with moderate WBCs, no yeast, no trichomoniasis, no clue cells.  Gonorrhea, chlamydia, HIV, and syphilis test pending, patient would like to be treated prophylactically for gonorrhea and chlamydia in the emergency department, Rocephin and azithromycin given.  Patient aware that she has cultures pending and that she will need to inform all sexual partners if positive.  Discussed importance of using protection when sexually active.  She is without abdominal pain or tenderness to abdomen/adnexa/cervix to raise concern for PID. Patient already has appointment scheduled with OB/GYN provider in 4 days, instructed she keep this for reevaluation. I discussed results, treatment plan, need for follow-up, and return precautions with the patient. Provided opportunity for questions, patient confirmed understanding and is in agreement with plan.    Final Clinical Impressions(s) / ED Diagnoses   Final diagnoses:  Vaginal discharge    ED Discharge Orders    None       Desmond Lope 11/02/17 2346    Gerhard Munch, MD 11/02/17 2348

## 2017-11-02 NOTE — ED Triage Notes (Signed)
Pt seen [facility] early part of June for UTI given keflex  And now states she feels like she is developing a yeast infection

## 2017-11-03 LAB — GC/CHLAMYDIA PROBE AMP (~~LOC~~) NOT AT ARMC
Chlamydia: NEGATIVE
Neisseria Gonorrhea: NEGATIVE

## 2017-11-03 LAB — RPR: RPR Ser Ql: NONREACTIVE

## 2017-11-03 LAB — HIV ANTIBODY (ROUTINE TESTING W REFLEX): HIV SCREEN 4TH GENERATION: NONREACTIVE

## 2017-11-06 ENCOUNTER — Ambulatory Visit: Payer: Medicaid Other | Admitting: Obstetrics & Gynecology

## 2017-11-06 ENCOUNTER — Encounter: Payer: Self-pay | Admitting: General Practice

## 2017-11-06 ENCOUNTER — Other Ambulatory Visit: Payer: Self-pay | Admitting: General Practice

## 2017-11-06 ENCOUNTER — Other Ambulatory Visit: Payer: Medicaid Other

## 2017-11-06 DIAGNOSIS — Z01419 Encounter for gynecological examination (general) (routine) without abnormal findings: Secondary | ICD-10-CM

## 2017-11-06 NOTE — Progress Notes (Unsigned)
Patient no showed for appt today, per Dr Marice Potterove patient can reschedule on her own.

## 2017-12-17 ENCOUNTER — Other Ambulatory Visit (HOSPITAL_COMMUNITY)
Admission: RE | Admit: 2017-12-17 | Discharge: 2017-12-17 | Disposition: A | Discharge status: Home / Self Care | Payer: Medicaid Other | Source: Ambulatory Visit | Attending: Obstetrics & Gynecology | Admitting: Obstetrics & Gynecology

## 2017-12-17 ENCOUNTER — Ambulatory Visit (INDEPENDENT_AMBULATORY_CARE_PROVIDER_SITE_OTHER): Payer: Medicaid Other | Admitting: Obstetrics & Gynecology

## 2017-12-17 VITALS — Wt 201.3 lb

## 2017-12-17 DIAGNOSIS — R87612 Low grade squamous intraepithelial lesion on cytologic smear of cervix (LGSIL): Secondary | ICD-10-CM | POA: Insufficient documentation

## 2017-12-17 DIAGNOSIS — Z01419 Encounter for gynecological examination (general) (routine) without abnormal findings: Secondary | ICD-10-CM

## 2017-12-17 DIAGNOSIS — Z0000 Encounter for general adult medical examination without abnormal findings: Secondary | ICD-10-CM

## 2017-12-17 DIAGNOSIS — Z01411 Encounter for gynecological examination (general) (routine) with abnormal findings: Secondary | ICD-10-CM | POA: Diagnosis not present

## 2017-12-17 LAB — POCT URINALYSIS DIP (DEVICE)
Bilirubin Urine: NEGATIVE
GLUCOSE, UA: NEGATIVE mg/dL
HGB URINE DIPSTICK: NEGATIVE
KETONES UR: NEGATIVE mg/dL
Leukocytes, UA: NEGATIVE
Nitrite: NEGATIVE
PH: 6 (ref 5.0–8.0)
PROTEIN: NEGATIVE mg/dL
SPECIFIC GRAVITY, URINE: 1.02 (ref 1.005–1.030)
UROBILINOGEN UA: 0.2 mg/dL (ref 0.0–1.0)

## 2017-12-17 NOTE — Progress Notes (Signed)
Subjective:    Paula [name] is a 22 y.o. female who presents for an annual exam. The patient has no complaints today. The patient is sexually active. GYN screening history: never had one before. The patient wears seatbelts: yes. The patient participates in regular exercise: working on it. Has the patient ever been transfused or tattooed?: yes. The patient reports that there is not domestic violence in her life.   Menstrual History: OB History    Gravida  0   Para  0   Term  0   Preterm  0   AB  0   Living  0     SAB  0   TAB  0   Ectopic  0   Multiple  0   Live Births              Menarche age: 22 years old LMP: ended Sunday 12/13/17 (bleeding lasted the whole month) Uses OCP: reports on/off use. Sometimes uses condoms. Is not interested in other birth control options at this time.  Patient asked about Yoni vaginal steaming and which soaps she should use in her vaginal area.   Family history:  Dad: diabetes, obesity Mom: None No breast, colon, ovarian, uterine cancers in the family as far as patient knows  Current meds:  - OCP - Uses clobetazole, ketoconazole, hydrocortisone, nystatin for seborrheic dermatitis and eczema  Allergies:  - None  Current medical conditions:  - Pre-diabetic  - Seborrheic dermatitis   Social:  - Works in call center - Minimal alcohol use  - No tobacco, vaping, or other drug use     The following portions of the patient's history were reviewed and updated as appropriate: allergies, current medications, past family history, past medical history, past social history, past surgical history and problem list.  Review of Systems Pertinent items are noted in HPI.   FH- no breast/gyn/colon cancer   Objective:    Wt 201 lb 4.8 oz (91.3 kg)   BMI 34.55 kg/m   General Appearance:    Alert, cooperative, no distress, appears stated age  Head:    Normocephalic, without obvious abnormality, atraumatic  Eyes:    PERRL,  conjunctiva/corneas clear, EOM's intact, fundi    benign, both eyes  Ears:    Normal TM's and external ear canals, both ears  Nose:   Nares normal, septum midline, mucosa normal, no drainage    or sinus tenderness  Throat:   Lips, mucosa, and tongue normal; teeth and gums normal  Neck:   Supple, symmetrical, trachea midline, no adenopathy;    thyroid:  no enlargement/tenderness/nodules; no carotid   bruit or JVD  Back:     Symmetric, no curvature, ROM normal, no CVA tenderness  Lungs:     Clear to auscultation bilaterally, respirations unlabored  Chest Wall:    No tenderness or deformity   Heart:    Regular rate and rhythm, S1 and S2 normal, no murmur, rub   or gallop  Breast Exam:    No tenderness, masses, or nipple abnormality  Abdomen:     Soft, non-tender, bowel sounds active all four quadrants,    no masses, no organomegaly  Genitalia:    Normal female without lesion, discharge or tenderness, nulliparous cervix     Extremities:   Extremities normal, atraumatic, no cyanosis or edema  Pulses:   2+ and symmetric all extremities  Skin:   Skin color, texture, turgor normal, no rashes or lesions  Lymph nodes:  Cervical, supraclavicular, and axillary nodes normal  Neurologic:   CNII-XII intact, normal strength, sensation and reflexes    throughout  .    Assessment:    Healthy female exam.    Plan:     Thin prep Pap smear.   Fasting labs

## 2017-12-18 LAB — HEPATITIS B SURFACE ANTIGEN: HEP B S AG: NEGATIVE

## 2017-12-18 LAB — HEPATITIS C ANTIBODY: Hep C Virus Ab: <0.1 s/co ratio (ref 0.0–0.9)

## 2017-12-21 LAB — CYTOLOGY - PAP

## 2017-12-28 ENCOUNTER — Encounter (HOSPITAL_COMMUNITY): Payer: Self-pay | Admitting: Emergency Medicine

## 2017-12-28 ENCOUNTER — Other Ambulatory Visit: Payer: Self-pay

## 2017-12-28 ENCOUNTER — Emergency Department (HOSPITAL_COMMUNITY)
Admission: EM | Admit: 2017-12-28 | Discharge: 2017-12-28 | Disposition: A | Discharge status: Home / Self Care | Payer: Medicaid Other | Attending: Emergency Medicine | Admitting: Emergency Medicine

## 2017-12-28 DIAGNOSIS — Z5321 Procedure and treatment not carried out due to patient leaving prior to being seen by health care provider: Secondary | ICD-10-CM | POA: Diagnosis not present

## 2017-12-28 DIAGNOSIS — M549 Dorsalgia, unspecified: Secondary | ICD-10-CM | POA: Diagnosis not present

## 2017-12-28 DIAGNOSIS — R102 Pelvic and perineal pain: Secondary | ICD-10-CM | POA: Insufficient documentation

## 2017-12-28 LAB — I-STAT BETA HCG BLOOD, ED (MC, WL, AP ONLY)

## 2017-12-28 NOTE — ED Triage Notes (Signed)
Pt reports having pelvic pain along with back pain. Pt reports having pap smear done on 12/17/17 and pain on right side.

## 2017-12-28 NOTE — ED Notes (Signed)
Pt called to be roomed with no response.  RN notified. 

## 2017-12-30 ENCOUNTER — Emergency Department (HOSPITAL_COMMUNITY): Payer: Medicaid Other

## 2017-12-30 ENCOUNTER — Other Ambulatory Visit: Payer: Self-pay

## 2017-12-30 ENCOUNTER — Emergency Department (HOSPITAL_COMMUNITY)
Admission: EM | Admit: 2017-12-30 | Discharge: 2017-12-30 | Disposition: A | Discharge status: Home / Self Care | Payer: Medicaid Other | Attending: Emergency Medicine | Admitting: Emergency Medicine

## 2017-12-30 ENCOUNTER — Encounter (HOSPITAL_COMMUNITY): Payer: Self-pay | Admitting: *Deleted

## 2017-12-30 DIAGNOSIS — Z5320 Procedure and treatment not carried out because of patient's decision for unspecified reasons: Secondary | ICD-10-CM

## 2017-12-30 DIAGNOSIS — Z79899 Other long term (current) drug therapy: Secondary | ICD-10-CM | POA: Insufficient documentation

## 2017-12-30 DIAGNOSIS — J45909 Unspecified asthma, uncomplicated: Secondary | ICD-10-CM | POA: Insufficient documentation

## 2017-12-30 DIAGNOSIS — E119 Type 2 diabetes mellitus without complications: Secondary | ICD-10-CM | POA: Diagnosis not present

## 2017-12-30 DIAGNOSIS — R1030 Lower abdominal pain, unspecified: Secondary | ICD-10-CM | POA: Diagnosis present

## 2017-12-30 DIAGNOSIS — R102 Pelvic and perineal pain: Secondary | ICD-10-CM | POA: Diagnosis not present

## 2017-12-30 DIAGNOSIS — Z5329 Procedure and treatment not carried out because of patient's decision for other reasons: Secondary | ICD-10-CM

## 2017-12-30 LAB — WET PREP, GENITAL
CLUE CELLS WET PREP: NONE SEEN
SPERM: NONE SEEN
Trich, Wet Prep: NONE SEEN
Yeast Wet Prep HPF POC: NONE SEEN

## 2017-12-30 LAB — URINALYSIS, ROUTINE W REFLEX MICROSCOPIC
Bilirubin Urine: NEGATIVE
Glucose, UA: NEGATIVE mg/dL
Ketones, ur: NEGATIVE mg/dL
Leukocytes, UA: NEGATIVE
Nitrite: NEGATIVE
PH: 6 (ref 5.0–8.0)
Protein, ur: NEGATIVE mg/dL
RBC / HPF: >50 RBC/hpf — ABNORMAL HIGH (ref 0–5)
SPECIFIC GRAVITY, URINE: 1.014 (ref 1.005–1.030)

## 2017-12-30 LAB — I-STAT BETA HCG BLOOD, ED (MC, WL, AP ONLY)

## 2017-12-30 NOTE — Discharge Instructions (Addendum)
You were seen in the ER for pelvic pain  You declined ultrasound as recommended and left against medical advise  Return to worsening or new symptoms, fevers, chills, burning with urination, increased vaginal discharge, vomiting, diarrhea.  Your gonorrhea and chlamydia test results are pending, you will be notified via phone call if they are positive. If positive, you need to seek medical treatment.   Can take acetaminophen (tylenol) or ibuprofen (motrin, advil) for pain as needed

## 2017-12-30 NOTE — ED Notes (Signed)
Follow up call made  Pt states she is on way to ed at present  12/30/17  1118  s Paula Punt rn

## 2017-12-30 NOTE — ED Provider Notes (Signed)
[facility] DEPT Provider Note   CSN: [id number] Arrival date & time: 12/30/17  1204     History   Chief Complaint Chief Complaint  Patient presents with  . Abdominal Pain    HPI Paula [name] is a 22 y.o. female is here for evaluation of lower abdominal pain.  Onset 1 to 2 weeks ago.  Pain is intermittent, mild, described as a "cramp that is trapped".  Associated with "heaviness" to the left lower abdomen.  She has been drinking a lot of cranberry juice and see Manus Rudd and this has improved her symptoms.  She began her menstrual cycle today.  States she does occasionally get cramping right before her period.  Notes that she had increased, thicker vaginal discharge right before her period began but now can no longer tell.  She denies any dysuria, hematuria, urgency, frequency, changes in bowel movements, fevers.  No history of pregnancy in the past.  No abdominal surgeries in the past.  She is sexually active with female partners only with inconsistent condom use.  No recent new partners.  She is not concerned about STD.  HPI  Past Medical History:  Diagnosis Date  . Asthma   . Diabetes mellitus without complication [facility])     Patient Active Problem List   Diagnosis Date Noted  . Morbid obesity ([place]) 12/17/2017  . Menometrorrhagia 01/19/2014  . General counseling for prescription of oral contraceptives 01/19/2014  . MRSA (methicillin resistant staph aureus) culture positive 09/03/2011  . Candida albicans infection 09/03/2011    Past Surgical History:  Procedure Laterality Date  . GINGIVECTOMY  2010  . TONSILECTOMY, ADENOIDECTOMY, BILATERAL MYRINGOTOMY AND TUBES       OB History    Gravida  0   Para  0   Term  0   Preterm  0   AB  0   Living  0     SAB  0   TAB  0   Ectopic  0   Multiple  0   Live Births               Home Medications    Prior to Admission medications   Medication Sig Start Date End Date Taking?  Authorizing Provider  norgestimate-ethinyl estradiol (ORTHO-CYCLEN,SPRINTEC,PREVIFEM) 0.25-35 MG-MCG tablet Take 1 tablet by mouth daily. 10/07/17  Yes Dove, Myra C, MD  nystatin ointment (MYCOSTATIN) Apply 1 application topically as needed (skin/scalp).   Yes [provider]    Family History Family History  Problem Relation Age of Onset  . Hypertension Maternal Grandmother     Social History Social History   Tobacco Use  . Smoking status: Never Smoker  . Smokeless tobacco: Never Used  Substance Use Topics  . Alcohol use: Yes  . Drug use: No     Allergies   Patient has no known allergies.   Review of Systems Review of Systems  Genitourinary: Positive for pelvic pain.  All other systems reviewed and are negative.    Physical Exam Updated Vital Signs BP 139/82 (BP Location: Right Arm)   Pulse 81   Temp 98.8 F (37.1 C) (Oral)   Resp 15   Ht _0  (1.626 m)   Wt 91.3 kg   LMP 12/12/2017   SpO2 100%   BMI 34.55 kg/m   Physical Exam  Constitutional: She is oriented to person, place, and time. She appears well-developed and well-nourished. No distress.  NAD.  HENT:  Head: Normocephalic and atraumatic.  Right Ear: External ear normal.  Left Ear: External ear normal.  Nose: Nose normal.  Eyes: Conjunctivae and EOM are normal. No scleral icterus.  Neck: Normal range of motion. Neck supple.  Cardiovascular: Normal rate, regular rhythm and normal heart sounds.  No murmur heard. Pulmonary/Chest: Effort normal and breath sounds normal. She has no wheezes.  Genitourinary: There is bleeding in the vagina.  Genitourinary Comments:  External genitalia normal without erythema, edema, tenderness, or lesions.  No groin lymphadenopathy.  Vaginal mucosa without lesions.  Moderate amount of blood in vaginal vault. Uterus in midline, smooth, not enlarged or tender. No CMT. Non palpable adnexa.  Musculoskeletal: Normal range of motion. She exhibits no deformity.    Neurological: She is alert and oriented to person, place, and time.  Skin: Skin is warm and dry. Capillary refill takes less than 2 seconds.  Psychiatric: She has a normal mood and affect. Her behavior is normal. Judgment and thought content normal.  Nursing note and vitals reviewed.    ED Treatments / Results  Labs (all labs ordered are listed, but only abnormal results are displayed) Labs Reviewed  WET PREP, GENITAL - Abnormal; Notable for the following components:      Result Value   WBC, Wet Prep HPF POC RARE (*)    All other components within normal limits  URINALYSIS, ROUTINE W REFLEX MICROSCOPIC - Abnormal; Notable for the following components:   Hgb urine dipstick LARGE (*)    RBC / HPF >50 (*)    Bacteria, UA RARE (*)    All other components within normal limits  URINE CULTURE  I-STAT BETA HCG BLOOD, ED ([facility], [facility], [facility] ONLY)  GC/CHLAMYDIA PROBE AMP ([place]) NOT AT [facility]    EKG None  Radiology No results found.  Procedures Procedures (including critical care time)  Medications Ordered in ED Medications - No data to display   Initial Impression / Assessment and Plan / ED Course  I have reviewed the triage vital signs and the nursing notes.  Pertinent labs & imaging results that were available during my care of the patient were reviewed by me and considered in my medical decision making (see chart for details).    Considering menstrual related cramping.  Less likely torsion given chronicity of symptoms.  Also possibly hemorrhagic ruptured cyst.  Physical exam is reassuring as above.  No CMT.  She has no new sexual partners making PID less likely.  No constitutional symptoms to suggest POA.  Will obtain pelvic ultrasound to rule out torsion.  1840: Pt did not let us tech perform exam.  I met with patient and explain indication of ultrasound. States she changed her mind and does not want ultrasound.  Discussed we have not completed work out and cannot rule out  torsion, cysts and risks of leaving AMA.  She verbalized understanding. Discussed return precautions. Her pain has resolved.  Final Clinical Impressions(s) / ED Diagnoses   Final diagnoses:  Pelvic pain in female  Left against medical advice    ED Discharge Orders    None       Arlean Hopping 12/30/17 1841    Daleen Bo, MD 12/31/17 [phone]

## 2017-12-30 NOTE — ED Triage Notes (Signed)
Pt presents with pelvic pain, abd cramps, just started cycle. Was here o the 26th and left due to wait.

## 2018-01-01 LAB — GC/CHLAMYDIA PROBE AMP (~~LOC~~) NOT AT ARMC
CHLAMYDIA, DNA PROBE: NEGATIVE
NEISSERIA GONORRHEA: NEGATIVE

## 2018-01-01 LAB — URINE CULTURE: Culture: <10000 — AB

## 2018-01-22 ENCOUNTER — Emergency Department (HOSPITAL_COMMUNITY)
Admission: EM | Admit: 2018-01-22 | Discharge: 2018-01-22 | Disposition: A | Discharge status: Home / Self Care | Payer: Medicaid Other | Attending: Emergency Medicine | Admitting: Emergency Medicine

## 2018-01-22 ENCOUNTER — Encounter (HOSPITAL_COMMUNITY): Payer: Self-pay | Admitting: *Deleted

## 2018-01-22 DIAGNOSIS — R450 Nervousness: Secondary | ICD-10-CM | POA: Diagnosis present

## 2018-01-22 LAB — CBG MONITORING, ED: GLUCOSE-CAPILLARY: 94 mg/dL (ref 70–99)

## 2018-01-22 NOTE — ED Triage Notes (Signed)
Pt present to ED with c/o of bilateral leg "pain" which she is not able to describe, sts it feels "jittery". Pt reports pain on and off in bothe legs for at least a few months, however today, she sts her right leg feels more jittery. Pt sts she googled her symptoms and came to conclusion it is either related to her being prediabetic, or to her period. Pt also sts she would like to be checked for fibroids, (as per Google suggestion) because her last period was heavy and painful. Pt sts occasionally her periods are very painful and most of the time she bleeds heavy. She reports overusing birth control in order to avoid getting her period

## 2018-01-22 NOTE — ED Notes (Signed)
Patient given graham crackers and water.  

## 2018-01-22 NOTE — Discharge Instructions (Signed)
Your blood sugar is normal.   Please use the phone number at the end of this packet to establish care with a regular doctor.   Avoid drinks and foods high in sugar.

## 2018-01-22 NOTE — ED Provider Notes (Signed)
[facility]-EMERGENCY DEPT Provider Note   CSN: [id number] Arrival date & time: 01/22/18  0909     History   Chief Complaint Chief Complaint  Patient presents with  . Multiple complaints    HPI Paula [name] is a 22 y.o. female.  HPI  Paula [name] is a 22 year old female with a history of asthma, prediabetes and obesity who presents to the emergency department for intermittent leg jittering.  Patient reports that her bilateral legs feel jittery after she has a sugary drink for the past several months now.  She denies any leg pain, swelling currently given she has not had a sugary drink today.  She states that she is concerned about her diabetes and her symptoms.  She denies fevers, chills, rash, numbness, weakness, lower back pain.  She does not take any medications for her prediabetes.  Past Medical History:  Diagnosis Date  . Asthma   . Diabetes mellitus without complication [facility])     Patient Active Problem List   Diagnosis Date Noted  . Morbid obesity (HCC) 12/17/2017  . Menometrorrhagia 01/19/2014  . General counseling for prescription of oral contraceptives 01/19/2014  . MRSA (methicillin resistant staph aureus) culture positive 09/03/2011  . Candida albicans infection 09/03/2011    Past Surgical History:  Procedure Laterality Date  . GINGIVECTOMY  2010  . TONSILECTOMY, ADENOIDECTOMY, BILATERAL MYRINGOTOMY AND TUBES       OB History    Gravida  0   Para  0   Term  0   Preterm  0   AB  0   Living  0     SAB  0   TAB  0   Ectopic  0   Multiple  0   Live Births               Home Medications    Prior to Admission medications   Medication Sig Start Date End Date Taking? Authorizing Provider  clobetasol (TEMOVATE) 0.05 % external solution Apply 1 application topically daily.   Yes [provider]  Emollient (DERMA SOOTHE EX) Apply 1 application topically daily.   Yes [provider]    norgestimate-ethinyl estradiol (ORTHO-CYCLEN,SPRINTEC,PREVIFEM) 0.25-35 MG-MCG tablet Take 1 tablet by mouth daily. 10/07/17  Yes Dove, Myra C, MD  nystatin ointment (MYCOSTATIN) Apply 1 application topically as needed (skin/scalp).   Yes [provider]    Family History Family History  Problem Relation Age of Onset  . Hypertension Maternal Grandmother     Social History Social History   Tobacco Use  . Smoking status: Never Smoker  . Smokeless tobacco: Never Used  Substance Use Topics  . Alcohol use: Yes  . Drug use: No     Allergies   Patient has no known allergies.   Review of Systems Review of Systems  Constitutional: Negative for chills and fever.  Cardiovascular: Negative for leg swelling.  Gastrointestinal: Negative for abdominal pain, nausea and vomiting.  Genitourinary: Negative for difficulty urinating and dysuria.  Musculoskeletal: Negative for gait problem.  Skin: Negative for rash.  Neurological: Negative for weakness and numbness.  Psychiatric/Behavioral: Negative for agitation.     Physical Exam Updated Vital Signs BP (!) 145/78   Pulse 82   Temp 98.6 F (37 C)   Resp 18   Wt 68.5 kg   LMP 01/18/2018   SpO2 100%   BMI 25.92 kg/m   Physical Exam  Constitutional: She is oriented to person, place, and time. She appears  well-developed and well-nourished. No distress.  No acute distress, appears comfortable.  HENT:  Head: Normocephalic and atraumatic.  Mouth/Throat: Oropharynx is clear and moist.  Eyes: Pupils are equal, round, and reactive to light. Conjunctivae are normal. Right eye exhibits no discharge. Left eye exhibits no discharge.  Neck: Normal range of motion. Neck supple.  Cardiovascular: Normal rate and regular rhythm.  Pulmonary/Chest: Effort normal and breath sounds normal. No stridor. No respiratory distress. She has no wheezes. She has no rales.  Abdominal: Soft. Bowel sounds are normal. There is no tenderness.   Musculoskeletal:  Bilateral legs without rash, erythema or swelling.  Full hip, knee and ankle range of motion bilaterally.  Strength 5/5 in bilateral lower extremities.  DP pulses 2+ and symmetric bilaterally.  Neurological: She is alert and oriented to person, place, and time. Coordination normal.  Ambulates independently without difficulty.  Normal gait.  Skin: Skin is warm and dry. She is not diaphoretic.  Psychiatric: She has a normal mood and affect. Her behavior is normal.  Nursing note and vitals reviewed.   ED Treatments / Results  Labs (all labs ordered are listed, but only abnormal results are displayed) Labs Reviewed  CBG MONITORING, ED    EKG None  Radiology No results found.  Procedures Procedures (including critical care time)  Medications Ordered in ED Medications - No data to display   Initial Impression / Assessment and Plan / ED Course  I have reviewed the triage vital signs and the nursing notes.  Pertinent labs & imaging results that were available during my care of the patient were reviewed by me and considered in my medical decision making (see chart for details).     Presents with jittering sensation in her legs after consuming sugary drinks. No pain or jittering in the legs currently. On exam bilateral legs neurovascularly intact without swelling or rash. Her CBG is 94. I have counseled patient that she should avoid sugary drinks and follow up with a PCP for general medical care as she does not have one currently. Patient agrees to plan.   Final Clinical Impressions(s) / ED Diagnoses   Final diagnoses:  Jittery feeling    ED Discharge Orders    None       Lawrence MarseillesShrosbree, Bracken Moffa J, PA-C 01/22/18 1340    Gwyneth SproutPlunkett, Whitney, MD 01/22/18 2116

## 2018-01-23 ENCOUNTER — Encounter (HOSPITAL_COMMUNITY): Payer: Self-pay | Admitting: *Deleted

## 2018-01-23 ENCOUNTER — Emergency Department (HOSPITAL_COMMUNITY)
Admission: EM | Admit: 2018-01-23 | Discharge: 2018-01-23 | Disposition: A | Discharge status: Home / Self Care | Payer: Medicaid Other | Attending: Emergency Medicine | Admitting: Emergency Medicine

## 2018-01-23 DIAGNOSIS — R102 Pelvic and perineal pain: Secondary | ICD-10-CM | POA: Diagnosis present

## 2018-01-23 DIAGNOSIS — Z79899 Other long term (current) drug therapy: Secondary | ICD-10-CM | POA: Diagnosis not present

## 2018-01-23 DIAGNOSIS — E119 Type 2 diabetes mellitus without complications: Secondary | ICD-10-CM | POA: Insufficient documentation

## 2018-01-23 DIAGNOSIS — J45909 Unspecified asthma, uncomplicated: Secondary | ICD-10-CM | POA: Insufficient documentation

## 2018-01-23 LAB — URINALYSIS, ROUTINE W REFLEX MICROSCOPIC
BILIRUBIN URINE: NEGATIVE
Glucose, UA: NEGATIVE mg/dL
KETONES UR: NEGATIVE mg/dL
LEUKOCYTES UA: NEGATIVE
Nitrite: NEGATIVE
PROTEIN: NEGATIVE mg/dL
Specific Gravity, Urine: 1.02 (ref 1.005–1.030)
pH: 5 (ref 5.0–8.0)

## 2018-01-23 LAB — PREGNANCY, URINE: PREG TEST UR: NEGATIVE

## 2018-01-23 MED ORDER — CEFTRIAXONE SODIUM 250 MG IJ SOLR
250.0000 mg | Freq: Once | INTRAMUSCULAR | Status: AC
  Administered 2018-01-23: 250 mg via INTRAMUSCULAR
  Filled 2018-01-23: qty 250

## 2018-01-23 MED ORDER — LIDOCAINE HCL 1 % IJ SOLN
INTRAMUSCULAR | Status: AC
  Filled 2018-01-23: qty 20

## 2018-01-23 MED ORDER — IBUPROFEN 800 MG PO TABS: 800.0000 mg | ORAL_TABLET | Freq: Three times a day (TID) | ORAL | 0 refills | Status: DC | PRN

## 2018-01-23 MED ORDER — AZITHROMYCIN 1 G PO PACK
1.0000 g | PACK | Freq: Once | ORAL | Status: AC
  Administered 2018-01-23: 1 g via ORAL
  Filled 2018-01-23: qty 1

## 2018-01-23 MED ORDER — DOXYCYCLINE HYCLATE 100 MG PO CAPS: 100.0000 mg | ORAL_CAPSULE | Freq: Two times a day (BID) | ORAL | 0 refills | Status: DC

## 2018-01-23 NOTE — Discharge Instructions (Signed)
Follow-up with the women's clinic you the end of this week beginning next week for recheck

## 2018-01-23 NOTE — ED Provider Notes (Signed)
[facility]-EMERGENC[facility] DEPT Provider Note   CSN: [id number] Arrival date & time: 01/23/18  1018     History   Chief Complaint Chief Complaint  Patient presents with  . Pelvic Pain    HPI Paula [name] is a 22 y.o. female.  Patient complains of lower abdominal pain.  No discharge  The history is provided by the patient. No language interpreter was used.  Pelvic Pain  This is a new problem. The current episode started more than 2 days ago. The problem occurs constantly. The problem has not changed since onset.Associated symptoms include chest pain and abdominal pain. Pertinent negatives include no headaches. Nothing aggravates the symptoms. Nothing relieves the symptoms. She has tried nothing for the symptoms. The treatment provided no relief.    Past Medical History:  Diagnosis Date  . Asthma   . Diabetes mellitus without complication [facility](HCC)     Patient Active Problem List   Diagnosis Date Noted  . Morbid obesity (HCC) 12/17/2017  . Menometrorrhagia 01/19/2014  . General counseling for prescription of oral contraceptives 01/19/2014  . MRSA (methicillin resistant staph aureus) culture positive 09/03/2011  . Candida albicans infection 09/03/2011    Past Surgical History:  Procedure Laterality Date  . GINGIVECTOMY  2010  . TONSILECTOMY, ADENOIDECTOMY, BILATERAL MYRINGOTOMY AND TUBES       OB History    Gravida  0   Para  0   Term  0   Preterm  0   AB  0   Living  0     SAB  0   TAB  0   Ectopic  0   Multiple  0   Live Births               Home Medications    Prior to Admission medications   Medication Sig Start Date End Date Taking? Authorizing Provider  clobetasol (TEMOVATE) 0.05 % external solution Apply 1 application topically daily.   Yes [provider]  fluconazole (DIFLUCAN) 150 MG tablet Take 150 mg by mouth daily.   Yes [provider]  norgestimate-ethinyl estradiol  (ORTHO-CYCLEN,SPRINTEC,PREVIFEM) 0.25-35 MG-MCG tablet Take 1 tablet by mouth daily. 10/07/17  Yes Dove, Myra C, MD  nystatin ointment (MYCOSTATIN) Apply 1 application topically as needed (skin/scalp).   Yes [provider]  doxycycline (VIBRAMYCIN) 100 MG capsule Take 1 capsule (100 mg total) by mouth 2 (two) times daily. One po bid x 7 days 01/23/18   Bethann BerkshireZammit, Avant Printy, MD  ibuprofen (ADVIL,MOTRIN) 800 MG tablet Take 1 tablet (800 mg total) by mouth every 8 (eight) hours as needed. 01/23/18   Bethann BerkshireZammit, Keary Hanak, MD    Family History Family History  Problem Relation Age of Onset  . Hypertension Maternal Grandmother     Social History Social History   Tobacco Use  . Smoking status: Never Smoker  . Smokeless tobacco: Never Used  Substance Use Topics  . Alcohol use: Yes  . Drug use: No     Allergies   Patient has no known allergies.   Review of Systems Review of Systems  Constitutional: Negative for appetite change and fatigue.  HENT: Negative for congestion, ear discharge and sinus pressure.   Eyes: Negative for discharge.  Respiratory: Negative for cough.   Cardiovascular: Positive for chest pain.  Gastrointestinal: Positive for abdominal pain. Negative for diarrhea.  Genitourinary: Positive for pelvic pain. Negative for frequency and hematuria.  Musculoskeletal: Negative for back pain.  Skin: Negative for rash.  Neurological: Negative  for seizures and headaches.  Psychiatric/Behavioral: Negative for hallucinations.     Physical Exam Updated Vital Signs BP 117/71   Pulse 79   Temp 98.5 F (36.9 C) (Oral)   Resp 16   Ht 5\' 5"  (1.651 m)   Wt 86.2 kg   LMP 01/18/2018   SpO2 98%   BMI 31.62 kg/m   Physical Exam  Constitutional: She is oriented to person, place, and time. She appears well-developed.  HENT:  Head: Normocephalic.  Eyes: Conjunctivae and EOM are normal. No scleral icterus.  Neck: Neck supple. No thyromegaly present.  Cardiovascular: Normal rate  and regular rhythm. Exam reveals no gallop and no friction rub.  No murmur heard. Pulmonary/Chest: No stridor. She has no wheezes. She has no rales. She exhibits no tenderness.  Abdominal: She exhibits no distension. There is tenderness. There is no rebound.  Tender suprapubic  Genitourinary:  Genitourinary Comments: Pelvic exam.  Patient has tenderness to cervix bilateral adnexal tenderness.  Also yellow discharge.  Musculoskeletal: Normal range of motion. She exhibits no edema.  Lymphadenopathy:    She has no cervical adenopathy.  Neurological: She is oriented to person, place, and time. She exhibits normal muscle tone. Coordination normal.  Skin: No rash noted. No erythema.  Psychiatric: She has a normal mood and affect. Her behavior is normal.     ED Treatments / Results  Labs (all labs ordered are listed, but only abnormal results are displayed) Labs Reviewed  URINALYSIS, ROUTINE W REFLEX MICROSCOPIC - Abnormal; Notable for the following components:      Result Value   Hgb urine dipstick SMALL (*)    Bacteria, UA RARE (*)    All other components within normal limits  PREGNANCY, URINE  GC/CHLAMYDIA PROBE AMP ([place]) NOT AT [facility]    EKG None  Radiology No results found.  Procedures Procedures (including critical care time)  Medications Ordered in ED Medications  cefTRIAXone (ROCEPHIN) injection 250 mg (has no administration in time range)  azithromycin (ZITHROMAX) powder 1 g (has no administration in time range)     Initial Impression / Assessment and Plan / ED Course  I have reviewed the triage vital signs and the nursing notes.  Pertinent labs & imaging results that were available during my care of the patient were reviewed by me and considered in my medical decision making (see chart for details).     Patient with possible pelvic infection.  She will be treated with Rocephin and Zithromax and doxycycline and follow-up with women's clinic Final Clinical  Impressions(s) / ED Diagnoses   Final diagnoses:  Pelvic pain in female    ED Discharge Orders         Ordered    doxycycline (VIBRAMYCIN) 100 MG capsule  2 times daily     01/23/18 1323    ibuprofen (ADVIL,MOTRIN) 800 MG tablet  Every 8 hours PRN     01/23/18 1323           Bethann Berkshire, MD 01/23/18 1327

## 2018-01-23 NOTE — ED Triage Notes (Signed)
Pt complains pelvic pain for the past month. Pt states she just had her menstrual period. Pt denies vaginal discharge, itching, or bleeding.

## 2018-01-25 LAB — GC/CHLAMYDIA PROBE AMP (~~LOC~~) NOT AT ARMC
Chlamydia: NEGATIVE
Neisseria Gonorrhea: NEGATIVE

## 2018-02-26 ENCOUNTER — Other Ambulatory Visit: Payer: Self-pay

## 2018-02-26 ENCOUNTER — Ambulatory Visit (HOSPITAL_COMMUNITY)
Admission: EM | Admit: 2018-02-26 | Discharge: 2018-02-26 | Disposition: A | Discharge status: Home / Self Care | Payer: Medicaid Other | Attending: Physician Assistant | Admitting: Physician Assistant

## 2018-02-26 ENCOUNTER — Encounter (HOSPITAL_COMMUNITY): Payer: Self-pay

## 2018-02-26 DIAGNOSIS — B349 Viral infection, unspecified: Secondary | ICD-10-CM

## 2018-02-26 MED ORDER — CETIRIZINE HCL 10 MG PO TABS: 10.0000 mg | ORAL_TABLET | Freq: Every day | ORAL | 0 refills | Status: DC

## 2018-02-26 MED ORDER — PREDNISONE 50 MG PO TABS: 50.0000 mg | ORAL_TABLET | Freq: Every day | ORAL | 0 refills | Status: DC

## 2018-02-26 MED ORDER — FLUTICASONE PROPIONATE 50 MCG/ACT NA SUSP: 2.0000 | Freq: Every day | NASAL | 0 refills | Status: DC

## 2018-02-26 NOTE — ED Triage Notes (Signed)
Pt c/o jaw pain and sinus pressure. X 3 days

## 2018-02-26 NOTE — ED Provider Notes (Signed)
[facility]    CSN: [id number] Arrival date & time: 02/26/18  1711     History   Chief Complaint Chief Complaint  Patient presents with  . Jaw Pain  . Facial Pain    HPI Paula [name] is a 22 y.o. female.   22 year old female comes in for 3 day history of sinus pressure/jaw pain. States has had rhinorrhea, nasal congestion. Denies fever, chills, night sweats. Feels pain around the throat area with mild swelling. Denies difficulty swallowing. Still eating and drinking normally. States took grandmother's medicine that starts with a "C" for sinus infection with some relief. Has not taken anything else for the symptoms.      Past Medical History:  Diagnosis Date  . Asthma   . Diabetes mellitus without complication [facility])     Patient Active Problem List   Diagnosis Date Noted  . Morbid obesity (HCC) 12/17/2017  . Menometrorrhagia 01/19/2014  . General counseling for prescription of oral contraceptives 01/19/2014  . MRSA (methicillin resistant staph aureus) culture positive 09/03/2011  . Candida albicans infection 09/03/2011    Past Surgical History:  Procedure Laterality Date  . GINGIVECTOMY  2010  . TONSILECTOMY, ADENOIDECTOMY, BILATERAL MYRINGOTOMY AND TUBES      OB History    Gravida  0   Para  0   Term  0   Preterm  0   AB  0   Living  0     SAB  0   TAB  0   Ectopic  0   Multiple  0   Live Births               Home Medications    Prior to Admission medications   Medication Sig Start Date End Date Taking? Authorizing Provider  cetirizine (ZYRTEC) 10 MG tablet Take 1 tablet (10 mg total) by mouth daily. 02/26/18   Cathie Hoops, Amy V, PA-C  clobetasol (TEMOVATE) 0.05 % external solution Apply 1 application topically daily.    [provider]  doxycycline (VIBRAMYCIN) 100 MG capsule Take 1 capsule (100 mg total) by mouth 2 (two) times daily. One po bid x 7 days 01/23/18   Bethann Berkshire, MD  fluconazole (DIFLUCAN) 150 MG tablet  Take 150 mg by mouth daily.    [provider]  fluticasone (FLONASE) 50 MCG/ACT nasal spray Place 2 sprays into both nostrils daily. 02/26/18   Cathie Hoops, Amy V, PA-C  ibuprofen (ADVIL,MOTRIN) 800 MG tablet Take 1 tablet (800 mg total) by mouth every 8 (eight) hours as needed. 01/23/18   Bethann Berkshire, MD  norgestimate-ethinyl estradiol (ORTHO-CYCLEN,SPRINTEC,PREVIFEM) 0.25-35 MG-MCG tablet Take 1 tablet by mouth daily. 10/07/17   Allie Bossier, MD  nystatin ointment (MYCOSTATIN) Apply 1 application topically as needed (skin/scalp).    [provider]  predniSONE (DELTASONE) 50 MG tablet Take 1 tablet (50 mg total) by mouth daily. 02/26/18   Belinda Fisher, PA-C    Family History Family History  Problem Relation Age of Onset  . Hypertension Maternal Grandmother     Social History Social History   Tobacco Use  . Smoking status: Never Smoker  . Smokeless tobacco: Never Used  Substance Use Topics  . Alcohol use: Yes  . Drug use: No     Allergies   Patient has no known allergies.   Review of Systems Review of Systems  Reason unable to perform ROS: See HPI as above.     Physical Exam Triage Vital Signs ED  Triage Vitals  Enc Vitals Group     BP 02/26/18 1731 125/72     Pulse Rate 02/26/18 1731 85     Resp 02/26/18 1731 18     Temp 02/26/18 1731 97.9 F (36.6 C)     Temp src --      SpO2 02/26/18 1731 100 %     Weight 02/26/18 1732 190 lb (86.2 kg)     Height --      Head Circumference --      Peak Flow --      Pain Score --      Pain Loc --      Pain Edu? --      Excl. in GC? --    No data found.  Updated Vital Signs BP 125/72 (BP Location: Right Arm)   Pulse 85   Temp 97.9 F (36.6 C)   Resp 18   Wt 190 lb (86.2 kg)   LMP 02/18/2018   SpO2 100%   BMI 31.62 kg/m   Physical Exam  Constitutional: She is oriented to person, place, and time. She appears well-developed and well-nourished. No distress.  Patient eating cookies throughout exam.  HENT:    Head: Normocephalic and atraumatic.  Right Ear: Tympanic membrane, external ear and ear canal normal. Tympanic membrane is not erythematous and not bulging.  Left Ear: Tympanic membrane, external ear and ear canal normal. Tympanic membrane is not erythematous and not bulging.  Nose: Nose normal. Right sinus exhibits no maxillary sinus tenderness and no frontal sinus tenderness. Left sinus exhibits no maxillary sinus tenderness and no frontal sinus tenderness.  Mouth/Throat: Uvula is midline, oropharynx is clear and moist and mucous membranes are normal.  No tenderness to palpation of the jaw. No tenderness to palpation along gum line/teeth. No facial swelling.   Eyes: Pupils are equal, round, and reactive to light. Conjunctivae are normal.  Neck: Normal range of motion. Neck supple.  Cardiovascular: Normal rate, regular rhythm and normal heart sounds. Exam reveals no gallop and no friction rub.  No murmur heard. Pulmonary/Chest: Effort normal and breath sounds normal. No stridor. No respiratory distress. She has no decreased breath sounds. She has no wheezes. She has no rhonchi. She has no rales.  Lymphadenopathy:    She has no cervical adenopathy.  Neurological: She is alert and oriented to person, place, and time.  Skin: Skin is warm and dry. She is not diaphoretic.  Psychiatric: She has a normal mood and affect. Her behavior is normal. Judgment normal.     UC Treatments / Results  Labs (all labs ordered are listed, but only abnormal results are displayed) Labs Reviewed - No data to display  EKG None  Radiology No results found.  Procedures Procedures (including critical care time)  Medications Ordered in UC Medications - No data to display  Initial Impression / Assessment and Plan / UC Course  I have reviewed the triage vital signs and the nursing notes.  Pertinent labs & imaging results that were available during my care of the patient were reviewed by me and considered  in my medical decision making (see chart for details).    Exam unremarkable. Will provide prednisone for sinus pressure. Other symptomatic treatment discussed. Return precautions given.  Final Clinical Impressions(s) / UC Diagnoses   Final diagnoses:  Viral illness    ED Prescriptions    Medication Sig Dispense Auth. Provider   predniSONE (DELTASONE) 50 MG tablet Take 1 tablet (50 mg total) by mouth  daily. 5 tablet Yu, Amy V, PA-C   fluticasone (FLONASE) 50 MCG/ACT nasal spray Place 2 sprays into both nostrils daily. 1 g Yu, Amy V, PA-C   cetirizine (ZYRTEC) 10 MG tablet Take 1 tablet (10 mg total) by mouth daily. 15 tablet Threasa Alpha, [place] 02/26/18 1821

## 2018-02-26 NOTE — Discharge Instructions (Signed)
No signs of bacterial infection. Start prednisone to help with sinus pressure. You can use zyrtec and flonase as well.  Keep hydrated, your urine should be clear to pale yellow in color. Tylenol/motrin for fever and pain. Monitor for any worsening of symptoms, chest pain, shortness of breath, wheezing, swelling of the throat, follow up for reevaluation.

## 2018-12-17 ENCOUNTER — Encounter: Payer: Self-pay | Admitting: Emergency Medicine

## 2018-12-17 ENCOUNTER — Other Ambulatory Visit: Payer: Self-pay

## 2018-12-17 ENCOUNTER — Ambulatory Visit
Admission: EM | Admit: 2018-12-17 | Discharge: 2018-12-17 | Disposition: A | Discharge status: Home / Self Care | Payer: Medicaid Other | Attending: Emergency Medicine | Admitting: Emergency Medicine

## 2018-12-17 DIAGNOSIS — H60502 Unspecified acute noninfective otitis externa, left ear: Secondary | ICD-10-CM

## 2018-12-17 MED ORDER — NEOMYCIN-POLYMYXIN-HC 3.5-10000-1 OT SUSP: 4.0000 [drp] | Freq: Three times a day (TID) | OTIC | 0 refills | Status: DC

## 2018-12-17 NOTE — Discharge Instructions (Signed)
Use eardrops as directed. To help prevent ear infections, use a solution of 1 part isopropyl (rubbing) alcohol, 1 part white vinegar (acetic acid) in both ears after swimming.  Return for worsening pain, decreased hearing, facial swelling, jaw pain, fever

## 2018-12-17 NOTE — ED Provider Notes (Signed)
[name]    CSN: [id number] Arrival date & time: 12/17/18  1052     History   Chief Complaint Chief Complaint  Patient presents with  . Otalgia    HPI Paula [name] is a 23 y.o. female presenting for left-sided ear pain since yesterday.  Patient has been sleeping on her [facility] fan, wonder if this could have made it worse.  Patient denies foreign body, bloody discharge, jaw pain, fever.  She tried putting oil in her ear this morning without relief of symptoms.  Patient denies prolonged water exposure.   Past Medical History:  Diagnosis Date  . Asthma   . Diabetes mellitus without complication [facility](HCC)     Patient Active Problem List   Diagnosis Date Noted  . Morbid obesity (HCC) 12/17/2017  . Menometrorrhagia 01/19/2014  . General counseling for prescription of oral contraceptives 01/19/2014  . MRSA (methicillin resistant staph aureus) culture positive 09/03/2011  . Candida albicans infection 09/03/2011    Past Surgical History:  Procedure Laterality Date  . GINGIVECTOMY  2010  . TONSILECTOMY, ADENOIDECTOMY, BILATERAL MYRINGOTOMY AND TUBES      OB History    Gravida  0   Para  0   Term  0   Preterm  0   AB  0   Living  0     SAB  0   TAB  0   Ectopic  0   Multiple  0   Live Births               Home Medications    Prior to Admission medications   Medication Sig Start Date End Date Taking? Authorizing Provider  cetirizine (ZYRTEC) 10 MG tablet Take 1 tablet (10 mg total) by mouth daily. 02/26/18   Cathie HoopsYu, Amy V, PA-C  clobetasol (TEMOVATE) 0.05 % external solution Apply 1 application topically daily.    [provider]  fluconazole (DIFLUCAN) 150 MG tablet Take 150 mg by mouth daily.    [provider]  fluticasone (FLONASE) 50 MCG/ACT nasal spray Place 2 sprays into both nostrils daily. 02/26/18   Cathie HoopsYu, Amy V, PA-C  ibuprofen (ADVIL,MOTRIN) 800 MG tablet Take 1 tablet (800 mg total) by mouth every 8 (eight) hours as  needed. 01/23/18   Bethann BerkshireZammit, Joseph, MD  neomycin-polymyxin-hydrocortisone (CORTISPORIN) 3.5-10000-1 OTIC suspension Place 4 drops into the left ear 3 (three) times daily. 12/17/18   Hall-Potvin, [place], PA-C  norgestimate-ethinyl estradiol (ORTHO-CYCLEN,SPRINTEC,PREVIFEM) 0.25-35 MG-MCG tablet Take 1 tablet by mouth daily. 10/07/17   Allie Bossierove, Myra C, MD  nystatin ointment (MYCOSTATIN) Apply 1 application topically as needed (skin/scalp).    [provider]    Family History Family History  Problem Relation Age of Onset  . Hypertension Maternal Grandmother     Social History Social History   Tobacco Use  . Smoking status: Never Smoker  . Smokeless tobacco: Never Used  Substance Use Topics  . Alcohol use: Yes  . Drug use: No     Allergies   Patient has no known allergies.   Review of Systems Review of Systems  Constitutional: Negative for fatigue and fever.  HENT: Positive for ear pain. Negative for congestion, dental problem, ear discharge, facial swelling, hearing loss, sinus pain, sore throat, trouble swallowing and voice change.   Eyes: Negative for photophobia, pain and visual disturbance.  Respiratory: Negative for cough and shortness of breath.   Cardiovascular: Negative for chest pain and palpitations.  Gastrointestinal: Negative for diarrhea and vomiting.  Musculoskeletal:  Negative for arthralgias and myalgias.  Neurological: Negative for dizziness and headaches.     Physical Exam Triage Vital Signs ED Triage Vitals [12/17/18 1059]  Enc Vitals Group     BP 130/87     Pulse Rate 89     Resp 16     Temp 98.5 F (36.9 C)     Temp Source Oral     SpO2 96 %     Weight      Height      Head Circumference      Peak Flow      Pain Score      Pain Loc      Pain Edu?      Excl. in [place]?    No data found.  Updated Vital Signs BP 130/87 (BP Location: Left Arm)   Pulse 89   Temp 98.5 F (36.9 C) (Oral)   Resp 16   LMP 12/17/2018   SpO2 96%   Visual  Acuity Right Eye Distance:   Left Eye Distance:   Bilateral Distance:    Right Eye Near:   Left Eye Near:    Bilateral Near:     Physical Exam Constitutional:      General: She is not in acute distress. HENT:     Head: Normocephalic and atraumatic.     Jaw: There is normal jaw occlusion. No tenderness or pain on movement.     Right Ear: Hearing, tympanic membrane, ear canal and external ear normal. No tenderness. No mastoid tenderness.     Left Ear: Hearing, tympanic membrane and external ear normal. No tenderness. No mastoid tenderness.     Ears:     Comments: Left ear with moderate amount of waxy, pale discharge and mild EAC erythema, edema.  Small amount of oil noted in EAC    Nose: No nasal deformity, septal deviation or nasal tenderness.     Right Turbinates: Not swollen or pale.     Left Turbinates: Not swollen or pale.     Right Sinus: No maxillary sinus tenderness or frontal sinus tenderness.     Left Sinus: No maxillary sinus tenderness or frontal sinus tenderness.     Mouth/Throat:     Lips: Pink. No lesions.     Mouth: Mucous membranes are moist. No injury.     Pharynx: Oropharynx is clear. Uvula midline. No posterior oropharyngeal erythema or uvula swelling.     Comments: no tonsillar exudate or hypertrophy Neck:     Musculoskeletal: Normal range of motion and neck supple. No muscular tenderness.  Cardiovascular:     Rate and Rhythm: Normal rate.  Pulmonary:     Effort: Pulmonary effort is normal.  Lymphadenopathy:     Cervical: No cervical adenopathy.  Neurological:     Mental Status: She is alert and oriented to person, place, and time.      UC Treatments / Results  Labs (all labs ordered are listed, but only abnormal results are displayed) Labs Reviewed - No data to display  EKG   Radiology No results found.  Procedures Procedures (including critical care time)  Medications Ordered in UC Medications - No data to display  Initial Impression /  Assessment and Plan / UC Course  I have reviewed the triage vital signs and the nursing notes.  Pertinent labs & imaging results that were available during my care of the patient were reviewed by me and considered in my medical decision making (see chart for details).  1.  AOE, left ear Ear irrigation done in office to remove oil so as to have better medication application, which patient tolerated well.  Will start Cortisporin today, return precautions discussed, patient verbalized understanding and is agreeable to plan. Final Clinical Impressions(s) / UC Diagnoses   Final diagnoses:  Acute otitis externa of left ear, unspecified type     Discharge Instructions     Use eardrops as directed. To help prevent ear infections, use a solution of 1 part isopropyl (rubbing) alcohol, 1 part white vinegar (acetic acid) in both ears after swimming.  Return for worsening pain, decreased hearing, facial swelling, jaw pain, fever    ED Prescriptions    Medication Sig Dispense Auth. Provider   neomycin-polymyxin-hydrocortisone (CORTISPORIN) 3.5-10000-1 OTIC suspension Place 4 drops into the left ear 3 (three) times daily. 10 mL Hall-Potvin, [place], PA-C     Controlled Substance Prescriptions [place] Controlled Substance Registry consulted? Not Applicable   Shea EvansHall-Potvin, Brittany, [place] JerseyPA-C 12/17/18 1131

## 2018-12-17 NOTE — ED Notes (Signed)
Patient able to ambulate independently  

## 2018-12-17 NOTE — ED Triage Notes (Signed)
Patient presents to [facility] for assessment of left ear fullness starting after a nap yesterday under the [employer] unit.  Patient states pain developed after she did an olive oil treatment to the ear.

## 2019-01-04 ENCOUNTER — Emergency Department (HOSPITAL_COMMUNITY)
Admission: EM | Admit: 2019-01-04 | Discharge: 2019-01-05 | Disposition: A | Discharge status: Home / Self Care | Payer: Medicaid Other | Attending: Emergency Medicine | Admitting: Emergency Medicine

## 2019-01-04 ENCOUNTER — Other Ambulatory Visit: Payer: Self-pay

## 2019-01-04 ENCOUNTER — Encounter (HOSPITAL_COMMUNITY): Payer: Self-pay

## 2019-01-04 DIAGNOSIS — Z5321 Procedure and treatment not carried out due to patient leaving prior to being seen by health care provider: Secondary | ICD-10-CM | POA: Diagnosis not present

## 2019-01-04 DIAGNOSIS — R300 Dysuria: Secondary | ICD-10-CM | POA: Insufficient documentation

## 2019-01-04 DIAGNOSIS — N939 Abnormal uterine and vaginal bleeding, unspecified: Secondary | ICD-10-CM | POA: Diagnosis not present

## 2019-01-04 DIAGNOSIS — N898 Other specified noninflammatory disorders of vagina: Secondary | ICD-10-CM | POA: Insufficient documentation

## 2019-01-04 NOTE — ED Triage Notes (Signed)
States over the weekend dysuria and bleeding with white vaginal discharge no n/v voiced.

## 2019-01-20 ENCOUNTER — Other Ambulatory Visit: Payer: Self-pay

## 2019-01-20 ENCOUNTER — Encounter: Payer: Self-pay | Admitting: Emergency Medicine

## 2019-01-20 ENCOUNTER — Ambulatory Visit (INDEPENDENT_AMBULATORY_CARE_PROVIDER_SITE_OTHER): Payer: Medicaid Other

## 2019-01-20 ENCOUNTER — Ambulatory Visit
Admission: EM | Admit: 2019-01-20 | Discharge: 2019-01-20 | Disposition: A | Discharge status: Home / Self Care | Payer: Medicaid Other | Attending: Emergency Medicine | Admitting: Emergency Medicine

## 2019-01-20 DIAGNOSIS — R0981 Nasal congestion: Secondary | ICD-10-CM | POA: Diagnosis not present

## 2019-01-20 DIAGNOSIS — R05 Cough: Secondary | ICD-10-CM | POA: Diagnosis not present

## 2019-01-20 DIAGNOSIS — R059 Cough, unspecified: Secondary | ICD-10-CM

## 2019-01-20 MED ORDER — FLUTICASONE PROPIONATE 50 MCG/ACT NA SUSP: 2.0000 | Freq: Every day | NASAL | 0 refills | Status: DC

## 2019-01-20 MED ORDER — CETIRIZINE HCL 10 MG PO TABS: 10.0000 mg | ORAL_TABLET | Freq: Every day | ORAL | 0 refills | Status: DC

## 2019-01-20 NOTE — ED Triage Notes (Signed)
Pt presents to UC w/ c/o cough, congestion x1 week. Pt reports feeling "hot" at times. Back pain x2 days. Pt reports taking theraflu without relief of symptoms.

## 2019-01-20 NOTE — Discharge Instructions (Signed)
Part to take your allergy medication, Flonase every day as instructed. Take benzonatate as needed for cough: Do not exceed 3 pills in a 24-hour. May take over-the-counter Tylenol, ibuprofen, Mucinex for additional relief. Return for worsening cough, shortness of breath, fever.

## 2019-01-20 NOTE — ED Provider Notes (Signed)
[name]    CSN: [id number] Arrival date & time: 01/20/19  1232      History   Chief Complaint No chief complaint on file.   HPI Paula [name] is a 23 y.o. female with history of diabetes, asthma, morbid obesity presenting for 1 week course of nasal congestion, dry, nonproductive cough, hot flashes.  Patient also endorsing low back pain for the last 2 days.  States that this tends to flare now within: Denies inciting event, radiation of pain, urinary frequency/urgency.  Patient has taken TheraFlu without significant relief of symptoms.  Patient states that she had some posttussive emesis this morning: Denies blood, bile, projectile vomiting.  Patient has been able to keep down food, water sign symptom onset.  Patient denies barking cough, wheezing, inspiratory stridor, fever, known sick contacts.   Past Medical History:  Diagnosis Date  . Asthma   . Diabetes mellitus without complication [facility])     Patient Active Problem List   Diagnosis Date Noted  . Morbid obesity ([place]) 12/17/2017  . Menometrorrhagia 01/19/2014  . General counseling for prescription of oral contraceptives 01/19/2014  . MRSA (methicillin resistant staph aureus) culture positive 09/03/2011  . Candida albicans infection 09/03/2011    Past Surgical History:  Procedure Laterality Date  . GINGIVECTOMY  2010  . TONSILECTOMY, ADENOIDECTOMY, BILATERAL MYRINGOTOMY AND TUBES      OB History    Gravida  0   Para  0   Term  0   Preterm  0   AB  0   Living  0     SAB  0   TAB  0   Ectopic  0   Multiple  0   Live Births               Home Medications    Prior to Admission medications   Medication Sig Start Date End Date Taking? Authorizing Provider  cetirizine (ZYRTEC) 10 MG tablet Take 1 tablet (10 mg total) by mouth daily. 01/20/19   Hall-Potvin, [place], PA-C  clobetasol (TEMOVATE) 0.05 % external solution Apply 1 application topically daily.    [provider]   fluconazole (DIFLUCAN) 150 MG tablet Take 150 mg by mouth daily.    [provider]  fluticasone (FLONASE) 50 MCG/ACT nasal spray Place 2 sprays into both nostrils daily. 01/20/19   Hall-Potvin, [place], PA-C  ibuprofen (ADVIL,MOTRIN) 800 MG tablet Take 1 tablet (800 mg total) by mouth every 8 (eight) hours as needed. 01/23/18   Milton Ferguson, MD  neomycin-polymyxin-hydrocortisone (CORTISPORIN) 3.5-10000-1 OTIC suspension Place 4 drops into the left ear 3 (three) times daily. 12/17/18   Hall-Potvin, [place], PA-C  norgestimate-ethinyl estradiol (ORTHO-CYCLEN,SPRINTEC,PREVIFEM) 0.25-35 MG-MCG tablet Take 1 tablet by mouth daily. 10/07/17   Emily Filbert, MD  nystatin ointment (MYCOSTATIN) Apply 1 application topically as needed (skin/scalp).    [provider]    Family History Family History  Problem Relation Age of Onset  . Hypertension Maternal Grandmother     Social History Social History   Tobacco Use  . Smoking status: Never Smoker  . Smokeless tobacco: Never Used  Substance Use Topics  . Alcohol use: Yes    Comment: occasionally  . Drug use: No     Allergies   Patient has no known allergies.   Review of Systems Review of Systems  Constitutional: Negative for fatigue and fever.  HENT: Positive for congestion and postnasal drip. Negative for dental problem, ear pain, facial swelling, hearing loss,  rhinorrhea, sinus pressure, sinus pain, sneezing, sore throat, trouble swallowing and voice change.   Eyes: Negative for photophobia, pain, discharge, redness, itching and visual disturbance.  Respiratory: Negative for cough, chest tightness and shortness of breath.   Cardiovascular: Negative for chest pain and palpitations.  Gastrointestinal: Negative for abdominal pain, diarrhea and vomiting.  Musculoskeletal: Negative for arthralgias and myalgias.  Skin: Negative for rash and wound.  Neurological: Negative for dizziness, syncope and headaches.     Physical  Exam Triage Vital Signs ED Triage Vitals  Enc Vitals Group     BP      Pulse      Resp      Temp      Temp src      SpO2      Weight      Height      Head Circumference      Peak Flow      Pain Score      Pain Loc      Pain Edu?      Excl. in [place]?    No data found.  Updated Vital Signs BP 118/68 (BP Location: Left Arm)   Pulse 88   Temp 98.4 F (36.9 C) (Oral)   Resp 16   LMP 01/04/2019   SpO2 97%    Physical Exam Constitutional:      General: She is not in acute distress.    Appearance: She is obese. She is not ill-appearing.  HENT:     Head: Normocephalic and atraumatic.     Jaw: There is normal jaw occlusion. No tenderness or pain on movement.     Right Ear: Hearing, tympanic membrane, ear canal and external ear normal. No tenderness. No mastoid tenderness.     Left Ear: Hearing, tympanic membrane, ear canal and external ear normal. No tenderness. No mastoid tenderness.     Nose: No nasal deformity, septal deviation or nasal tenderness.     Right Turbinates: Not swollen or pale.     Left Turbinates: Not swollen or pale.     Right Sinus: No maxillary sinus tenderness or frontal sinus tenderness.     Left Sinus: No maxillary sinus tenderness or frontal sinus tenderness.     Comments: Bilateral turbinate edema with mucosal injection    Mouth/Throat:     Lips: Pink. No lesions.     Mouth: Mucous membranes are moist. No injury.     Pharynx: Oropharynx is clear. Uvula midline. No posterior oropharyngeal erythema or uvula swelling.     Comments: no tonsillar exudate or hypertrophy Eyes:     General: No scleral icterus.    Pupils: Pupils are equal, round, and reactive to light.  Neck:     Musculoskeletal: Normal range of motion and neck supple. No muscular tenderness.  Cardiovascular:     Rate and Rhythm: Normal rate and regular rhythm.     Heart sounds: No murmur. No gallop.   Pulmonary:     Effort: Pulmonary effort is normal. No respiratory distress.      Breath sounds: No wheezing, rhonchi or rales.  Lymphadenopathy:     Cervical: No cervical adenopathy.  Skin:    Capillary Refill: Capillary refill takes less than 2 seconds.     Coloration: Skin is not jaundiced or pale.  Neurological:     General: No focal deficit present.     Mental Status: She is alert and oriented to person, place, and time.      UC Treatments /  Results  Labs (all labs ordered are listed, but only abnormal results are displayed) Labs Reviewed  NOVEL CORONAVIRUS, NAA    EKG   Radiology Dg Chest 2 View  Result Date: 01/20/2019 CLINICAL DATA:  Productive cough, emesis EXAM: CHEST - 2 VIEW COMPARISON:  None. FINDINGS: The heart size and mediastinal contours are within normal limits. Both lungs are clear. The visualized skeletal structures are unremarkable. IMPRESSION: No acute abnormality of the lungs. Electronically Signed   By: Lauralyn PrimesAlex  Bibbey M.D.   On: 01/20/2019 13:16    Procedures Procedures (including critical care time)  Medications Ordered in UC Medications - No data to display  Initial Impression / Assessment and Plan / UC Course  I have reviewed the triage vital signs and the nursing notes.  Pertinent labs & imaging results that were available during my care of the patient were reviewed by me and considered in my medical decision making (see chart for details).     1.  Cough Chest x-ray done in office, reviewed by me and radiology: No acute abnormality of lung was noted.  Will trial benzonatate, cool mist vapor/hot showers.  Stressed importance of allergy medication compliance as postnasal drip is likely contributing to cough.  Return precautions discussed, patient verbalized understanding and is agreeable to plan. Final Clinical Impressions(s) / UC Diagnoses   Final diagnoses:  Cough  Nasal congestion     Discharge Instructions     Part to take your allergy medication, Flonase every day as instructed. Take benzonatate as needed for  cough: Do not exceed 3 pills in a 24-hour. May take over-the-counter Tylenol, ibuprofen, Mucinex for additional relief. Return for worsening cough, shortness of breath, fever.    ED Prescriptions    Medication Sig Dispense Auth. Provider   cetirizine (ZYRTEC) 10 MG tablet Take 1 tablet (10 mg total) by mouth daily. 30 tablet Hall-Potvin, [place], PA-C   fluticasone (FLONASE) 50 MCG/ACT nasal spray Place 2 sprays into both nostrils daily. 16 g Hall-Potvin, [place], PA-C     PDMP not reviewed this encounter.   Hall-Potvin, [place], [place]-C 01/20/19 1842

## 2019-01-22 LAB — NOVEL CORONAVIRUS, NAA: SARS-CoV-2, NAA: NOT DETECTED

## 2019-01-24 ENCOUNTER — Encounter (HOSPITAL_COMMUNITY): Payer: Self-pay

## 2019-02-02 ENCOUNTER — Emergency Department (HOSPITAL_COMMUNITY)
Admission: EM | Admit: 2019-02-02 | Discharge: 2019-02-02 | Disposition: A | Discharge status: Home / Self Care | Payer: Medicaid Other | Attending: Emergency Medicine | Admitting: Emergency Medicine

## 2019-02-02 ENCOUNTER — Other Ambulatory Visit: Payer: Self-pay

## 2019-02-02 DIAGNOSIS — N907 Vulvar cyst: Secondary | ICD-10-CM

## 2019-02-02 DIAGNOSIS — Z113 Encounter for screening for infections with a predominantly sexual mode of transmission: Secondary | ICD-10-CM | POA: Diagnosis not present

## 2019-02-02 DIAGNOSIS — J45909 Unspecified asthma, uncomplicated: Secondary | ICD-10-CM | POA: Insufficient documentation

## 2019-02-02 DIAGNOSIS — N898 Other specified noninflammatory disorders of vagina: Secondary | ICD-10-CM | POA: Diagnosis present

## 2019-02-02 DIAGNOSIS — R030 Elevated blood-pressure reading, without diagnosis of hypertension: Secondary | ICD-10-CM | POA: Diagnosis not present

## 2019-02-02 DIAGNOSIS — B9689 Other specified bacterial agents as the cause of diseases classified elsewhere: Secondary | ICD-10-CM

## 2019-02-02 DIAGNOSIS — E119 Type 2 diabetes mellitus without complications: Secondary | ICD-10-CM | POA: Diagnosis not present

## 2019-02-02 LAB — WET PREP, GENITAL
Sperm: NONE SEEN
Trich, Wet Prep: NONE SEEN
Yeast Wet Prep HPF POC: NONE SEEN

## 2019-02-02 MED ORDER — METRONIDAZOLE 500 MG PO TABS: 500.0000 mg | ORAL_TABLET | Freq: Two times a day (BID) | ORAL | 0 refills | Status: AC

## 2019-02-02 MED ORDER — MUPIROCIN 2 % EX OINT: 1.0000 "application " | TOPICAL_OINTMENT | Freq: Two times a day (BID) | CUTANEOUS | 0 refills | Status: DC

## 2019-02-02 NOTE — ED Provider Notes (Signed)
[facility] DEPT Provider Note   CSN: [id number] Arrival date & time: 02/02/19  1111     History   Chief Complaint Chief Complaint  Patient presents with   Vaginal Itching    HPI Paula [name] is a 23 y.o. female with a past medical history significant for asthma and diabetes mellitus who presents to the ED on 9/30 due to sudden onset of a vaginal cyst that started Sunday after shaving. Patient admits her pain is an 8/10, worse when clothes rub against it. Patient has tried warm compresses and sitz baths with mild relief. She also wants gonorrhea and chlamydia testing. Patient admits to being sexually active with one partner with no protection. She denies any vaginal or urination symptoms. LMP was sometime this month, but unsure of the date. Patient denies chest pain and shortness of breath  Past Medical History:  Diagnosis Date   Asthma    Diabetes mellitus without complication [facility])     Patient Active Problem List   Diagnosis Date Noted   Morbid obesity ([place]) 12/17/2017   Menometrorrhagia 01/19/2014   General counseling for prescription of oral contraceptives 01/19/2014   MRSA (methicillin resistant staph aureus) culture positive 09/03/2011   Candida albicans infection 09/03/2011    Past Surgical History:  Procedure Laterality Date   GINGIVECTOMY  2010   TONSILECTOMY, ADENOIDECTOMY, BILATERAL MYRINGOTOMY AND TUBES       OB History    Gravida  0   Para  0   Term  0   Preterm  0   AB  0   Living  0     SAB  0   TAB  0   Ectopic  0   Multiple  0   Live Births               Home Medications    Prior to Admission medications   Medication Sig Start Date End Date Taking? Authorizing Provider  fluticasone (FLONASE) 50 MCG/ACT nasal spray Place 2 sprays into both nostrils daily. Patient taking differently: Place 2 sprays into both nostrils daily as needed for allergies.  01/20/19  Yes Hall-Potvin, [place], PA-C   cetirizine (ZYRTEC) 10 MG tablet Take 1 tablet (10 mg total) by mouth daily. Patient not taking: Reported on 02/02/2019 01/20/19   Hall-Potvin, [place], PA-C  ibuprofen (ADVIL,MOTRIN) 800 MG tablet Take 1 tablet (800 mg total) by mouth every 8 (eight) hours as needed. Patient not taking: Reported on 02/02/2019 01/23/18   Milton Ferguson, MD  metroNIDAZOLE (FLAGYL) 500 MG tablet Take 1 tablet (500 mg total) by mouth 2 (two) times daily for 7 days. 02/02/19 02/09/19  Cheek, Comer Locket, PA-C  mupirocin ointment (BACTROBAN) 2 % Place 1 application into the nose 2 (two) times daily. 02/02/19   Cheek, Comer Locket, PA-C  neomycin-polymyxin-hydrocortisone (CORTISPORIN) 3.5-10000-1 OTIC suspension Place 4 drops into the left ear 3 (three) times daily. Patient not taking: Reported on 02/02/2019 12/17/18   Hall-Potvin, [place], PA-C  norgestimate-ethinyl estradiol (ORTHO-CYCLEN,SPRINTEC,PREVIFEM) 0.25-35 MG-MCG tablet Take 1 tablet by mouth daily. Patient not taking: Reported on 02/02/2019 10/07/17   Emily Filbert, MD    Family History Family History  Problem Relation Age of Onset   Hypertension Maternal Grandmother     Social History Social History   Tobacco Use   Smoking status: Never Smoker   Smokeless tobacco: Never Used  Substance Use Topics   Alcohol use: Yes    Comment: occasionally   Drug use: No  Allergies   Patient has no known allergies.   Review of Systems Review of Systems  Respiratory: Negative for shortness of breath.   Cardiovascular: Negative for chest pain.  Gastrointestinal: Negative for abdominal pain, diarrhea, nausea and vomiting.  Genitourinary: Positive for genital sores and vaginal pain (labial pain). Negative for difficulty urinating, dysuria, pelvic pain, vaginal bleeding and vaginal discharge.  Musculoskeletal: Negative for back pain.  All other systems reviewed and are negative.    Physical Exam Updated Vital Signs BP 121/82 (BP Location: Left Arm)     Pulse 84    Temp 98.6 F (37 C) (Oral)    Resp 16    Ht 5\' 5"  (1.651 m)    Wt 99.8 kg    LMP 01/04/2019    SpO2 100%    BMI 36.61 kg/m   Physical Exam Vitals signs and nursing note reviewed. Exam conducted with a chaperone present.  Constitutional:      General: She is not in acute distress. HENT:     Head: Normocephalic.  Neck:     Musculoskeletal: Neck supple.  Cardiovascular:     Rate and Rhythm: Normal rate and regular rhythm.     Heart sounds: Normal heart sounds. No murmur. No friction rub. No gallop.   Pulmonary:     Effort: Pulmonary effort is normal.     Breath sounds: Normal breath sounds.  Abdominal:     General: Abdomen is flat. There is no distension.     Palpations: Abdomen is soft.     Tenderness: There is no abdominal tenderness. There is no guarding.  Genitourinary:    Labia:        Right: Tenderness and lesion present.        Left: No tenderness or lesion.      Vagina: Vaginal discharge present. No erythema or bleeding.     Cervix: Discharge present. No friability or erythema.     Comments: Small area of induration on outside of right labia with small area of erythema. Malodorous milk discharge in vagina. Musculoskeletal: Normal range of motion.  Skin:    General: Skin is warm and dry.     Findings: Lesion (see GU section) present.  Neurological:     General: No focal deficit present.      ED Treatments / Results  Labs (all labs ordered are listed, but only abnormal results are displayed) Labs Reviewed  WET PREP, GENITAL - Abnormal; Notable for the following components:      Result Value   Clue Cells Wet Prep HPF POC PRESENT (*)    WBC, Wet Prep HPF POC MANY (*)    All other components within normal limits  GC/CHLAMYDIA PROBE AMP ([place]    EKG None  Radiology No results found.  Procedures Procedures (including critical care time)  Medications Ordered in ED Medications - No data to display   Initial Impression /  Assessment and Plan / ED Course  I have reviewed the triage vital signs and the nursing notes.  Pertinent labs & imaging results that were available during my care of the patient were reviewed by me and considered in my medical decision making (see chart for details).  Paula [name] is a 23 year old female who presents to the ED due to labial irritation and STI testing. On physical exam, outside of right labia has a small area of induration with mild erythema. Area is not large enough to I&D. Will send patient home with  Bactroban ointment for irritation.  Patient requested STI testing and wet prep. Patient states she only wants chlamydia and gonorrhea and doesn't need HIV, syphilis, etc. Pelvic exam demonstrated milky discharge with foul odor. Wet prep came back with clue cells, given the malodorous nature, will treat patient for BV with Flagyl 500mg  BID for 7 days. Patient instructed not to drink alcohol while on medication. Patient states understanding.  Blood pressure elevated in ED, patient admits to lying on cuff when taking blood pressure. Asked nurse to get another one and latest BP is 121/82. Given patient is asymptomatic and elevated readings were possibly due to cuff pressure, will have patient f/u outpatient. Patient given a primary care office to call to establish care. Strict return protocols were discussed with patient. Patient states understanding.  Final Clinical Impressions(s) / ED Diagnoses   Final diagnoses:  Labial cyst  Elevated blood pressure reading  Routine screening for STI (sexually transmitted infection)  BV (bacterial vaginosis)    ED Discharge Orders         Ordered    mupirocin ointment (BACTROBAN) 2 %  2 times daily     02/02/19 1213    metroNIDAZOLE (FLAGYL) 500 MG tablet  2 times daily     02/02/19 1352           02/04/19, PA-C 02/02/19 1408    02/04/19, DO 02/02/19 1527

## 2019-02-02 NOTE — ED Triage Notes (Signed)
Patient reports pain, itching on labia. Started Sunday 01/30/2019. Pain is 8/10. Patient states she shaved and it feels "hard" down there and seems like it may be an ingrown hair.

## 2019-02-02 NOTE — Discharge Instructions (Signed)
-  You have been given Bactroban ointment for your labial cyst. You can use this ointment twice a day. Put a little bit of ointment on your finger then over the affected area. Return the hospital if it gets progressively worse or you start to have a fever. Continue to use warm compresses on affected area. -Your wet prep showed that you have bacterial vaginosis. An antibiotic will be prescribed. Please take medication as directed (twice daily for 7 days). Do not drink alcohol while on the medication. -I have given you a number for a primary care provider. Please call and make an appointment to establish care. Have them recheck your blood pressure. You can follow-up with them if your labial cyst does not go away in the next few days.

## 2019-02-03 LAB — GC/CHLAMYDIA PROBE AMP (~~LOC~~) NOT AT ARMC
Chlamydia: NEGATIVE
Molecular Disclaimer: NEGATIVE
Molecular Disclaimer: NORMAL
Neisseria Gonorrhea: NEGATIVE

## 2019-03-11 ENCOUNTER — Ambulatory Visit: Payer: Medicaid Other | Admitting: Obstetrics & Gynecology

## 2019-03-28 ENCOUNTER — Emergency Department (HOSPITAL_COMMUNITY): Payer: Medicaid Other

## 2019-03-28 ENCOUNTER — Emergency Department (HOSPITAL_COMMUNITY)
Admission: EM | Admit: 2019-03-28 | Discharge: 2019-03-28 | Disposition: A | Discharge status: Home / Self Care | Payer: Medicaid Other | Attending: Emergency Medicine | Admitting: Emergency Medicine

## 2019-03-28 ENCOUNTER — Other Ambulatory Visit: Payer: Self-pay

## 2019-03-28 ENCOUNTER — Encounter (HOSPITAL_COMMUNITY): Payer: Self-pay

## 2019-03-28 DIAGNOSIS — E119 Type 2 diabetes mellitus without complications: Secondary | ICD-10-CM | POA: Diagnosis not present

## 2019-03-28 DIAGNOSIS — R05 Cough: Secondary | ICD-10-CM | POA: Insufficient documentation

## 2019-03-28 DIAGNOSIS — J45909 Unspecified asthma, uncomplicated: Secondary | ICD-10-CM | POA: Diagnosis not present

## 2019-03-28 DIAGNOSIS — Z20828 Contact with and (suspected) exposure to other viral communicable diseases: Secondary | ICD-10-CM | POA: Insufficient documentation

## 2019-03-28 DIAGNOSIS — R059 Cough, unspecified: Secondary | ICD-10-CM

## 2019-03-28 LAB — SARS CORONAVIRUS 2 (TAT 6-24 HRS): SARS Coronavirus 2: NEGATIVE

## 2019-03-28 MED ORDER — BENZONATATE 100 MG PO CAPS: 100.0000 mg | ORAL_CAPSULE | Freq: Three times a day (TID) | ORAL | 0 refills | Status: DC | PRN

## 2019-03-28 NOTE — ED Provider Notes (Signed)
[facility]-EMERGENCY DEPT Provider Note   CSN: [id number] Arrival date & time: 03/28/19  0973     History   Chief Complaint Chief Complaint  Patient presents with  . Cough  . Back Pain  . Nausea    HPI Paula [name] is a 23 y.o. female past medical history of childhood asthma and pre-diabetes presents to emergency department today with chief complaint of cough x1 week.  She is also endorsing low back pain after long coughing episodes.  She states cough is nonproductive however has had posttussive emesis multiple times.  She has tried taking TheraFlu and Robitussin without symptom relief.   She is able to tolerate p.o. intake.  She denies any sick contacts.  She denies any contact with anyone positive for COVID-19.  She states she has had Covid testing twice since the onset of the pandemic both tests were negative.  Last Covid test x 2 months ago. She denies any fever, chills, congestion, chest pain, shortness of breath, wheezing.  She also denies any red flag symptoms for back pain including numbness/weakness of upper and lower extremities, bowel/bladder incontinence, urinary retention, history of cancer, saddle anesthesia, history of back surgery, history of IVDA.     Past Medical History:  Diagnosis Date  . Asthma   . Diabetes mellitus without complication [facility])     Patient Active Problem List   Diagnosis Date Noted  . Morbid obesity (HCC) 12/17/2017  . Menometrorrhagia 01/19/2014  . General counseling for prescription of oral contraceptives 01/19/2014  . MRSA (methicillin resistant staph aureus) culture positive 09/03/2011  . Candida albicans infection 09/03/2011    Past Surgical History:  Procedure Laterality Date  . GINGIVECTOMY  2010  . TONSILECTOMY, ADENOIDECTOMY, BILATERAL MYRINGOTOMY AND TUBES       OB History    Gravida  0   Para  0   Term  0   Preterm  0   AB  0   Living  0     SAB  0   TAB  0   Ectopic  0   Multiple   0   Live Births               Home Medications    Prior to Admission medications   Medication Sig Start Date End Date Taking? Authorizing Provider  benzonatate (TESSALON) 100 MG capsule Take 1 capsule (100 mg total) by mouth every 8 (eight) hours as needed for cough. 03/28/19   Albrizze, Kaitlyn E, PA-C  cetirizine (ZYRTEC) 10 MG tablet Take 1 tablet (10 mg total) by mouth daily. Patient not taking: Reported on 02/02/2019 01/20/19   Hall-Potvin, [place], PA-C  fluticasone (FLONASE) 50 MCG/ACT nasal spray Place 2 sprays into both nostrils daily. Patient taking differently: Place 2 sprays into both nostrils daily as needed for allergies.  01/20/19   Hall-Potvin, [place], PA-C  ibuprofen (ADVIL,MOTRIN) 800 MG tablet Take 1 tablet (800 mg total) by mouth every 8 (eight) hours as needed. Patient not taking: Reported on 02/02/2019 01/23/18   Bethann Berkshire, MD  mupirocin ointment (BACTROBAN) 2 % Place 1 application into the nose 2 (two) times daily. 02/02/19   Cheek, Vesta Mixer, PA-C  neomycin-polymyxin-hydrocortisone (CORTISPORIN) 3.5-10000-1 OTIC suspension Place 4 drops into the left ear 3 (three) times daily. Patient not taking: Reported on 02/02/2019 12/17/18   Hall-Potvin, [place], PA-C  norgestimate-ethinyl estradiol (ORTHO-CYCLEN,SPRINTEC,PREVIFEM) 0.25-35 MG-MCG tablet Take 1 tablet by mouth daily. Patient not taking: Reported on 02/02/2019 10/07/17   Marice Potter,  Leanora IvanoffMyra C, MD    Family History Family History  Problem Relation Age of Onset  . Hypertension Maternal Grandmother     Social History Social History   Tobacco Use  . Smoking status: Never Smoker  . Smokeless tobacco: Never Used  Substance Use Topics  . Alcohol use: Yes    Comment: occasionally  . Drug use: No     Allergies   Patient has no known allergies.   Review of Systems Review of Systems  Constitutional: Negative for chills and fever.  HENT: Negative for congestion, ear pain, facial swelling, rhinorrhea, sinus  pain and sore throat.   Eyes: Negative for pain and redness.  Respiratory: Positive for cough. Negative for shortness of breath and wheezing.   Cardiovascular: Negative for chest pain.  Gastrointestinal: Negative for abdominal pain, nausea and vomiting.  Musculoskeletal: Positive for back pain. Negative for arthralgias, myalgias and neck pain.  Skin: Negative for rash and wound.     Physical Exam Updated Vital Signs BP 125/78   Pulse (!) 104   Temp 98.3 F (36.8 C) (Oral)   Resp 16   Ht 5\' 4"  (1.626 m)   LMP 03/28/2019   SpO2 100%   BMI 37.76 kg/m   Physical Exam Vitals signs and nursing note reviewed.  Constitutional:      Appearance: She is well-developed. She is not ill-appearing or toxic-appearing.  HENT:     Head: Normocephalic and atraumatic.     Nose: Nose normal.  Eyes:     General: No scleral icterus.       Right eye: No discharge.        Left eye: No discharge.     Conjunctiva/sclera: Conjunctivae normal.  Neck:     Musculoskeletal: Normal range of motion.     Vascular: No JVD.  Cardiovascular:     Rate and Rhythm: Normal rate and regular rhythm.     Pulses: Normal pulses.     Heart sounds: Normal heart sounds.  Pulmonary:     Effort: Pulmonary effort is normal.     Breath sounds: Normal breath sounds.     Comments: Patient has normal work of breathing.  Lungs are clear to auscultation in all fields.  No accessory muscle use.  No wheezing, rales or rhonchi noted.  She is speaking in full sentences.  SPO2 100% on room air. Abdominal:     General: There is no distension.  Musculoskeletal: Normal range of motion.       Arms:     Comments: Tenderness to palpation as depicted in image above without midline tenderness of lumbar spine. Full range of motion of the T-spine and L-spine No tenderness to palpation of the spinous processes of the T-spine or L-spine No crepitus, deformity or step-offs   Skin:    General: Skin is warm and dry.  Neurological:      Mental Status: She is oriented to person, place, and time.     GCS: GCS eye subscore is 4. GCS verbal subscore is 5. GCS motor subscore is 6.     Comments: Fluent speech, no facial droop.  Sensation grossly intact to light touch in the lower extremities bilaterally. No saddle anesthesias. Strength 5/5 with flexion and extension at the bilateral hips, knees, and ankles. No noted gait deficit. Coordination intact with heel to shin testing.   Psychiatric:        Behavior: Behavior normal.      ED Treatments / Results  Labs (all labs ordered  are listed, but only abnormal results are displayed) Labs Reviewed  SARS CORONAVIRUS 2 (TAT 6-24 HRS)    EKG None  Radiology Dg Chest Portable 1 View  Result Date: 03/28/2019 CLINICAL DATA:  Cough EXAM: PORTABLE CHEST 1 VIEW COMPARISON:  January 20, 2019 FINDINGS: Lungs are clear. Heart size and pulmonary vascularity are normal. No adenopathy. No pneumothorax or pneumomediastinum. No bone lesions. IMPRESSION: No edema or consolidation.  Cardiac silhouette within normal limits. Electronically Signed   By: Lowella Grip III M.D.   On: 03/28/2019 11:26    Procedures Procedures (including critical care time)  Medications Ordered in ED Medications - No data to display   Initial Impression / Assessment and Plan / ED Course  I have reviewed the triage vital signs and the nursing notes.  Pertinent labs & imaging results that were available during my care of the patient were reviewed by me and considered in my medical decision making (see chart for details).   I have reviewed patient's EMR to obtain pertinent PMH to assist in MDM.  Symptoms and exam most suggestive of uncomplicated viral illness. DDX incluldes viral URI/LRI, COVID-19.  No travel. No known exposures to confirmed COVID-19.  Exam is benign.  Normal WOB. No fever, tachypnea, tachycardia, hypoxemia. Lungs are CTAB.  On exam no neurological deficits in neuro exam without  findings.  No concern for cauda equina. Chest x-ray viewed by me shows no infiltrate, pneumonia unlikely. Doubt bacterial bronchitis or pneumonia.  No signs or symptoms to suggest strep pharyngitis.  No clinical signs of severe illness, dehydration, to warrant further emergent work up in ER.  I ambulated patient in the room and she did so without respiratory distress, SPO2 stayed at 100%. Patient was noted to be tachycardic to 104 in triage.  I rechecked her vitals and her heart rate was 92.  Given reassuring physical exam, symptoms, will discharge with symptomatic treatment.  Recommended PCP f/u in the next 2-3 days for persistent symptoms  for further guidance.  And out Covid test performed.  Patient aware she will need to self quarantine until she has the result.. Self-isolation instructions discussed. Pt was given home self-isolation instructions and instructions for family members.  Pt understands signs and symptoms that would warrant return to ED.  Pt comfortable and agreeable with POC.    ANSLEE MICHELETTI was evaluated in Emergency Department on 03/28/2019 for the symptoms described in the history of present illness. She was evaluated in the context of the global COVID-19 pandemic, which necessitated consideration that the patient might be at risk for infection with the SARS-CoV-2 virus that causes COVID-19. Institutional protocols and algorithms that pertain to the evaluation of patients at risk for COVID-19 are in a state of rapid change based on information released by regulatory bodies including the CDC and federal and state organizations. These policies and algorithms were followed during the patient's care in the ED.    Portions of this note were generated with [employer]. Dictation errors may occur despite best attempts at proofreading.   Final Clinical Impressions(s) / ED Diagnoses   Final diagnoses:  Cough    ED Discharge Orders         Ordered    benzonatate  (TESSALON) 100 MG capsule  Every 8 hours PRN     03/28/19 1156           Albrizze, Harley Hallmark, PA-C 03/28/19 1200    Maudie Flakes, MD 03/30/19 [phone]

## 2019-03-28 NOTE — Discharge Instructions (Addendum)
Thank you for allowing [place] to care for you today.   Please return to the emergency department if you have any new or worsening symptoms.  You were tested for coronavirus today.  The results to be available in 24 to 48 hours.  You will get a call if the test is positive.  If it is negative you will show up in your MyChart.  A prescription has been sent to your pharmacy for Tessalon.  This is a cough medicine.  Please take as prescribed.   Your xray does not show any pneumonia.  Medications- You can take medications to help treat your symptoms: -Tylenol for fever and body aches. Please take as prescribed on the bottle. -Over the coutner cough medicine such as mucinex, robitussin, or other brands.  Treatment- This is a virus and unfortunately there are no antibitotics approved to treat this virus at this time. It is important to monitor your symptoms closely: -You should have a theremometer at home to check your temperature when feeling feverish. -Use a pulse ox meter to measure your oxygen when feeling short of breath.  -If your fever is over 100.4 despite taking tylenol or if your oxygen level drops below 94% these are reasons to rturn to the emergency department for further evaluation. Please call the emergency department before you come to make [place] aware.    We recommend you self-isolate for 10 days and to inform your work/family/friends that you has the virus.  They will need to self-quarantine for 14 days to monitor for symptoms.    Again: symptoms of shortnessf breath, chest pain, difficulty breathing, new onset of confuison, any symptoms that are concerning. And you or the person should come to emergency department for evaluation.   I hope you feel better soon

## 2019-03-28 NOTE — ED Triage Notes (Signed)
Patient c/o  Non productive cough and states she coughs so hard she vomiti small amounts. Patient states she began having back pain while in triage from coughing so hard.

## 2019-10-17 ENCOUNTER — Other Ambulatory Visit: Payer: Self-pay

## 2019-10-17 ENCOUNTER — Ambulatory Visit (HOSPITAL_COMMUNITY)
Admission: RE | Admit: 2019-10-17 | Discharge: 2019-10-17 | Disposition: A | Discharge status: Home / Self Care | Payer: Medicaid Other | Source: Ambulatory Visit | Attending: Family Medicine | Admitting: Family Medicine

## 2019-10-17 ENCOUNTER — Encounter (HOSPITAL_COMMUNITY): Payer: Self-pay

## 2019-10-17 VITALS — BP 143/91 | HR 76 | Temp 98.6°F | Resp 16 | Ht 64.0 in | Wt 245.0 lb

## 2019-10-17 DIAGNOSIS — R053 Chronic cough: Secondary | ICD-10-CM

## 2019-10-17 DIAGNOSIS — J45909 Unspecified asthma, uncomplicated: Secondary | ICD-10-CM | POA: Insufficient documentation

## 2019-10-17 DIAGNOSIS — Z6837 Body mass index (BMI) 37.0-37.9, adult: Secondary | ICD-10-CM | POA: Diagnosis not present

## 2019-10-17 DIAGNOSIS — Z79899 Other long term (current) drug therapy: Secondary | ICD-10-CM | POA: Insufficient documentation

## 2019-10-17 DIAGNOSIS — R05 Cough: Secondary | ICD-10-CM

## 2019-10-17 DIAGNOSIS — Z20822 Contact with and (suspected) exposure to covid-19: Secondary | ICD-10-CM | POA: Insufficient documentation

## 2019-10-17 DIAGNOSIS — E119 Type 2 diabetes mellitus without complications: Secondary | ICD-10-CM | POA: Insufficient documentation

## 2019-10-17 MED ORDER — BENZONATATE 100 MG PO CAPS: 100.0000 mg | ORAL_CAPSULE | Freq: Three times a day (TID) | ORAL | 0 refills | Status: AC | PRN

## 2019-10-17 MED ORDER — CETIRIZINE HCL 10 MG PO TABS: 10.0000 mg | ORAL_TABLET | Freq: Every day | ORAL | 0 refills | Status: DC

## 2019-10-17 MED ORDER — ALBUTEROL SULFATE HFA 108 (90 BASE) MCG/ACT IN AERS
INHALATION_SPRAY | RESPIRATORY_TRACT | Status: AC
  Filled 2019-10-17: qty 6.7

## 2019-10-17 MED ORDER — ALBUTEROL SULFATE HFA 108 (90 BASE) MCG/ACT IN AERS
2.0000 | INHALATION_SPRAY | Freq: Once | RESPIRATORY_TRACT | Status: AC
  Administered 2019-10-17: 2 via RESPIRATORY_TRACT

## 2019-10-17 MED ORDER — PSEUDOEPH-BROMPHEN-DM 30-2-10 MG/5ML PO SYRP: 5.0000 mL | ORAL_SOLUTION | Freq: Every evening | ORAL | 0 refills | Status: DC | PRN

## 2019-10-17 NOTE — ED Triage Notes (Signed)
Pt c/o non productive cough, chills, itching throatx1wk. Pt has non labored breathing. Skin color WNL.

## 2019-10-17 NOTE — ED Provider Notes (Signed)
[facility]    CSN: [id number] Arrival date & time: 10/17/19  0086      History   Chief Complaint Chief Complaint  Patient presents with  . Cough    HPI Paula [name] is a 24 y.o. female.   HPI  Patient presents today for evaluation of a persistent cough.  She has a distant history of asthma as an adolescent however has not had any recurring exacerbations.  She reports an intermittent on and off persistent cough expanding since the latter part of 2020.  The cough mildly improved with antitussive therapy however strongly temporal.  The cough is worse at night and is a combination of productive and nonproductive. Over the last week she has had accompanying symptoms of irritation of her throat and some chills.  She denies any known COVID-19 exposure, however is being tested for COVID-19 today.  She is afebrile.  She endorses closure to secondhand smoke in her household.  Past Medical History:  Diagnosis Date  . Asthma   . Diabetes mellitus without complication [facility])     Patient Active Problem List   Diagnosis Date Noted  . Morbid obesity (HCC) 12/17/2017  . Menometrorrhagia 01/19/2014  . General counseling for prescription of oral contraceptives 01/19/2014  . MRSA (methicillin resistant staph aureus) culture positive 09/03/2011  . Candida albicans infection 09/03/2011    Past Surgical History:  Procedure Laterality Date  . GINGIVECTOMY  2010  . TONSILECTOMY, ADENOIDECTOMY, BILATERAL MYRINGOTOMY AND TUBES      OB History    Gravida  0   Para  0   Term  0   Preterm  0   AB  0   Living  0     SAB  0   TAB  0   Ectopic  0   Multiple  0   Live Births               Home Medications    Prior to Admission medications   Medication Sig Start Date End Date Taking? Authorizing Provider  benzonatate (TESSALON) 100 MG capsule Take 1-2 capsules (100-200 mg total) by mouth every 8 (eight) hours as needed for up to 7 days for cough. 10/17/19 10/24/19   Bing Neighbors, FNP  brompheniramine-pseudoephedrine-DM 30-2-10 MG/5ML syrup Take 5 mLs by mouth at bedtime as needed and may repeat dose one time if needed. 10/17/19   Bing Neighbors, FNP  cetirizine (ZYRTEC) 10 MG tablet Take 1 tablet (10 mg total) by mouth at bedtime. 10/17/19   Bing Neighbors, FNP  fluticasone (FLONASE) 50 MCG/ACT nasal spray Place 2 sprays into both nostrils daily. Patient taking differently: Place 2 sprays into both nostrils daily as needed for allergies.  01/20/19   Hall-Potvin, [place], PA-C  ibuprofen (ADVIL,MOTRIN) 800 MG tablet Take 1 tablet (800 mg total) by mouth every 8 (eight) hours as needed. Patient not taking: Reported on 02/02/2019 01/23/18   Bethann Berkshire, MD  mupirocin ointment (BACTROBAN) 2 % Place 1 application into the nose 2 (two) times daily. 02/02/19   Mannie Stabile, PA-C  neomycin-polymyxin-hydrocortisone (CORTISPORIN) 3.5-10000-1 OTIC suspension Place 4 drops into the left ear 3 (three) times daily. Patient not taking: Reported on 02/02/2019 12/17/18   Hall-Potvin, [place], PA-C  norgestimate-ethinyl estradiol (ORTHO-CYCLEN,SPRINTEC,PREVIFEM) 0.25-35 MG-MCG tablet Take 1 tablet by mouth daily. Patient not taking: Reported on 02/02/2019 10/07/17   Allie Bossier, MD    Family History Family History  Problem Relation Age of Onset  .  Hypertension Maternal Grandmother     Social History Social History   Tobacco Use  . Smoking status: Never Smoker  . Smokeless tobacco: Never Used  Vaping Use  . Vaping Use: Never used  Substance Use Topics  . Alcohol use: Yes    Comment: occasionally  . Drug use: No     Allergies   Patient has no known allergies.   Review of Systems Review of Systems Pertinent negatives listed in HPI Physical Exam Triage Vital Signs ED Triage Vitals  Enc Vitals Group     BP 10/17/19 1002 (!) 143/91     Pulse Rate 10/17/19 1002 76     Resp 10/17/19 1002 16     Temp 10/17/19 1002 98.6 F (37 C)     Temp  Source 10/17/19 1002 Oral     SpO2 10/17/19 1002 96 %     Weight 10/17/19 1003 245 lb (111.1 kg)     Height 10/17/19 1003 5\' 4"  (1.626 m)     Head Circumference --      Peak Flow --      Pain Score 10/17/19 1002 0     Pain Loc --      Pain Edu? --      Excl. in [place]? --    No data found.  Updated Vital Signs BP (!) 143/91   Pulse 76   Temp 98.6 F (37 C) (Oral)   Resp 16   Ht 5\' 4"  (1.626 m)   Wt 245 lb (111.1 kg)   SpO2 96%   BMI 42.05 kg/m   Visual Acuity Right Eye Distance:   Left Eye Distance:   Bilateral Distance:    Right Eye Near:   Left Eye Near:    Bilateral Near:     Physical Exam Constitutional: Patient appears well, obese, no distress. HENT: Normocephalic, atraumatic, External right and left ear normal. Oropharynx is clear and moist.  Eyes: Conjunctivae and EOM are normal. PERRLA, no scleral icterus. Neck: Normal ROM. Neck supple. No JVD. No tracheal deviation. No thyromegaly. CVS: RRR, S1/S2 +, no murmurs, no gallops, no carotid bruit.  Pulmonary: Effort and breath sounds normal, no stridor, rhonchi, wheezes, rales.  Skin: Skin is warm and dry. No rash noted. Not diaphoretic. No erythema. No pallor. UC Treatments / Results  Labs (all labs ordered are listed, but only abnormal results are displayed) Labs Reviewed  SARS CORONAVIRUS 2 (TAT 6-24 HRS)    EKG   Radiology No results found.  Procedures Procedures (including critical care time)  Medications Ordered in UC Medications  albuterol (VENTOLIN HFA) 108 (90 Base) MCG/ACT inhaler 2 puff (has no administration in time range)    Initial Impression / Assessment and Plan / UC Course  I have reviewed the triage vital signs and the nursing notes.  Pertinent labs & imaging results that were available during my care of the patient were reviewed by me and considered in my medical decision making (see chart for details).    COVID-19 test pending however patient has had no known close exposure.   Given the persistence and chronicity of cough, suspect patient may be developing cough variant related asthma.  We discussed agreeing to trial an albuterol inhaler when these episodes present in addition to doing a daily antihistamine and antitussive therapy.  Provided patient with follow-up information to [facility] as she is without a primary care provider.  Advised that the symptoms may be chronic and may have periods of exacerbation and  periods without symptoms.  Patient verbalized understanding and agreement with plan.  See orders. Final Clinical Impressions(s) / UC Diagnoses   Final diagnoses:  Persistent cough for 3 weeks or longer   Discharge Instructions   None    ED Prescriptions    Medication Sig Dispense Auth. Provider   benzonatate (TESSALON) 100 MG capsule Take 1-2 capsules (100-200 mg total) by mouth every 8 (eight) hours as needed for up to 7 days for cough. 30 capsule Bing Neighbors, FNP   cetirizine (ZYRTEC) 10 MG tablet Take 1 tablet (10 mg total) by mouth at bedtime. 30 tablet Bing Neighbors, FNP   brompheniramine-pseudoephedrine-DM 30-2-10 MG/5ML syrup Take 5 mLs by mouth at bedtime as needed and may repeat dose one time if needed. 120 mL Bing Neighbors, FNP     PDMP not reviewed this encounter.   Bing Neighbors, FNP 10/17/19 1039

## 2019-10-18 LAB — SARS CORONAVIRUS 2 (TAT 6-24 HRS): SARS Coronavirus 2: NEGATIVE

## 2019-11-12 ENCOUNTER — Other Ambulatory Visit: Payer: Self-pay | Admitting: Family Medicine

## 2019-11-12 NOTE — Telephone Encounter (Signed)
Requested medication (s) are due for refill today: yes  Requested medication (s) are on the active medication list: yes  Last refill:  10/17/19  Future visit scheduled: yes 11/14/19 to establish care  Notes to clinic:  Sending to office- pt has appt Monday 7/12 to establish care   Requested Prescriptions  Pending Prescriptions Disp Refills   cetirizine (ZYRTEC) 10 MG tablet [Pharmacy Med Name: CETIRIZINE HCL 10 MG TABLET] 30 tablet 0    Sig: TAKE 1 TABLET BY MOUTH EVERYDAY AT BEDTIME      There is no refill protocol information for this order

## 2019-11-14 ENCOUNTER — Telehealth (INDEPENDENT_AMBULATORY_CARE_PROVIDER_SITE_OTHER): Payer: Medicaid Other | Admitting: Primary Care

## 2019-11-27 ENCOUNTER — Ambulatory Visit (HOSPITAL_COMMUNITY): Payer: Self-pay

## 2019-12-06 ENCOUNTER — Ambulatory Visit (HOSPITAL_COMMUNITY): Payer: Self-pay

## 2019-12-07 ENCOUNTER — Other Ambulatory Visit: Payer: Self-pay

## 2019-12-07 ENCOUNTER — Encounter (HOSPITAL_COMMUNITY): Payer: Self-pay

## 2019-12-07 ENCOUNTER — Ambulatory Visit (HOSPITAL_COMMUNITY)
Admission: RE | Admit: 2019-12-07 | Discharge: 2019-12-07 | Disposition: A | Discharge status: Home / Self Care | Payer: Medicaid Other | Source: Ambulatory Visit | Attending: Family Medicine | Admitting: Family Medicine

## 2019-12-07 VITALS — BP 119/82 | HR 96 | Temp 98.6°F | Resp 17

## 2019-12-07 DIAGNOSIS — H6982 Other specified disorders of Eustachian tube, left ear: Secondary | ICD-10-CM

## 2019-12-07 DIAGNOSIS — H9202 Otalgia, left ear: Secondary | ICD-10-CM | POA: Diagnosis not present

## 2019-12-07 DIAGNOSIS — R682 Dry mouth, unspecified: Secondary | ICD-10-CM

## 2019-12-07 DIAGNOSIS — H9209 Otalgia, unspecified ear: Secondary | ICD-10-CM

## 2019-12-07 DIAGNOSIS — H6992 Unspecified Eustachian tube disorder, left ear: Secondary | ICD-10-CM

## 2019-12-07 LAB — CBG MONITORING, ED: Glucose-Capillary: 102 mg/dL — ABNORMAL HIGH (ref 70–99)

## 2019-12-07 MED ORDER — PREDNISONE 10 MG (21) PO TBPK: ORAL_TABLET | Freq: Every day | ORAL | 0 refills | Status: DC

## 2019-12-07 NOTE — ED Triage Notes (Signed)
Pt presents with left year pain since yesterday. C/o dizziness and light headness. Sates she is unable to sleep on left side.

## 2019-12-08 NOTE — ED Provider Notes (Signed)
[facility]   [id number] 12/07/19 Arrival Time: 1450  ASSESSMENT & PLAN:  1. Otalgia of left ear   2. Eustachian tube dysfunction, left   3. Dry mouth     Begin trial of: Meds ordered this encounter  Medications   predniSONE (STERAPRED UNI-PAK 21 TAB) 10 MG (21) TBPK tablet    Sig: Take by mouth daily. Take as directed.    Dispense:  21 tablet    Refill:  0    OTC Zyrtec daily. May f/u with PCP or here as needed.  Labs Reviewed  CBG MONITORING, ED - Abnormal; Notable for the following components:      Result Value   Glucose-Capillary 102 (*)    All other components within normal limits    Reviewed expectations re: course of current medical issues. Questions answered. Outlined signs and symptoms indicating need for more acute intervention. Patient verbalized understanding. After Visit Summary given.   SUBJECTIVE: History from: patient.  Paula [name] is a 24 y.o. female who presents with complaint of left ear discomfort since yesterday. Occasional lightheaded feeling. Ear discomfort worse when sleeping on left side. No ear drainage or bleeding. Also complains of dry mouth lately. No urinary symptoms. No OTC tx.  Social History   Tobacco Use  Smoking Status Never Smoker  Smokeless Tobacco Never Used      OBJECTIVE:  Vitals:   12/07/19 1542  BP: 119/82  Pulse: 96  Resp: 17  Temp: 98.6 F (37 C)  TempSrc: Oral  SpO2: 98%     General appearance: alert; appears fatigued Ear Canal: normal TM: fluid behind both TMs with slight bulging Neck: supple without LAD Lungs: unlabored respirations; no respiratory distress Skin: warm and dry Psychological: alert and cooperative; normal mood and affect  No Known Allergies  Past Medical History:  Diagnosis Date   Asthma    Diabetes mellitus without complication (HCC)    Family History  Problem Relation Age of Onset   Hypertension Maternal Grandmother    Diabetes Father    Social History     Socioeconomic History   Marital status: Single    Spouse name: Not on file   Number of children: Not on file   Years of education: Not on file   Highest education level: Not on file  Occupational History   Not on file  Tobacco Use   Smoking status: Never Smoker   Smokeless tobacco: Never Used  Vaping Use   Vaping Use: Never used  Substance and Sexual Activity   Alcohol use: Yes    Comment: occasionally   Drug use: No   Sexual activity: Yes    Birth control/protection: Condom, Pill  Other Topics Concern   Not on file  Social History Narrative   Not on file   Social Determinants of Health   Financial Resource Strain:    Difficulty of Paying Living Expenses:   Food Insecurity:    Worried About Programme researcher, broadcasting/film/video in the Last Year:    Barista in the Last Year:   Transportation Needs:    Freight forwarder (Medical):    Lack of Transportation (Non-Medical):   Physical Activity:    Days of Exercise per Week:    Minutes of Exercise per Session:   Stress:    Feeling of Stress :   Social Connections:    Frequency of Communication with Friends and Family:    Frequency of Social Gatherings with Friends and Family:  Attends Religious Services:    Active Member of Clubs or Organizations:    Attends Banker Meetings:    Marital Status:   Intimate Partner Violence:    Fear of Current or Ex-Partner:    Emotionally Abused:    Physically Abused:    Sexually Abused:             Mardella Layman, MD 12/08/19 [phone]

## 2020-01-23 ENCOUNTER — Other Ambulatory Visit: Payer: Self-pay

## 2020-01-23 ENCOUNTER — Ambulatory Visit (HOSPITAL_COMMUNITY)
Admission: RE | Admit: 2020-01-23 | Discharge: 2020-01-23 | Disposition: A | Discharge status: Home / Self Care | Payer: Medicaid Other | Source: Ambulatory Visit | Attending: Family Medicine | Admitting: Family Medicine

## 2020-01-23 ENCOUNTER — Encounter (HOSPITAL_COMMUNITY): Payer: Self-pay

## 2020-01-23 VITALS — BP 110/69 | HR 105 | Temp 98.9°F | Resp 18

## 2020-01-23 DIAGNOSIS — N760 Acute vaginitis: Secondary | ICD-10-CM | POA: Diagnosis present

## 2020-01-23 DIAGNOSIS — R6883 Chills (without fever): Secondary | ICD-10-CM | POA: Diagnosis not present

## 2020-01-23 DIAGNOSIS — Z20822 Contact with and (suspected) exposure to covid-19: Secondary | ICD-10-CM | POA: Insufficient documentation

## 2020-01-23 DIAGNOSIS — R05 Cough: Secondary | ICD-10-CM | POA: Diagnosis not present

## 2020-01-23 DIAGNOSIS — R110 Nausea: Secondary | ICD-10-CM | POA: Diagnosis not present

## 2020-01-23 DIAGNOSIS — B349 Viral infection, unspecified: Secondary | ICD-10-CM

## 2020-01-23 LAB — POCT URINALYSIS DIPSTICK, ED / UC
Bilirubin Urine: NEGATIVE
Glucose, UA: NEGATIVE mg/dL
Nitrite: NEGATIVE
Protein, ur: 100 mg/dL — AB
Specific Gravity, Urine: >=1.03 (ref 1.005–1.030)
Urobilinogen, UA: 1 mg/dL (ref 0.0–1.0)
pH: 6 (ref 5.0–8.0)

## 2020-01-23 MED ORDER — PSEUDOEPH-BROMPHEN-DM 30-2-10 MG/5ML PO SYRP: 5.0000 mL | ORAL_SOLUTION | Freq: Every evening | ORAL | 0 refills | Status: DC | PRN

## 2020-01-23 MED ORDER — ONDANSETRON HCL 4 MG PO TABS: 4.0000 mg | ORAL_TABLET | Freq: Four times a day (QID) | ORAL | 0 refills | Status: DC

## 2020-01-23 MED ORDER — FLUCONAZOLE 150 MG PO TABS: 150.0000 mg | ORAL_TABLET | Freq: Once | ORAL | 0 refills | Status: AC

## 2020-01-23 NOTE — Discharge Instructions (Signed)
[facility] [employer] [facility] [employer] [facility] clinic [address]  [phone] Open ? Closes 5PM

## 2020-01-23 NOTE — ED Triage Notes (Signed)
Pt presents with cough, nausea, no appetite chill xs 1 wk.   Also c/o vaginal discomfort.

## 2020-01-23 NOTE — ED Provider Notes (Signed)
[facility]    CSN: [id number] Arrival date & time: 01/23/20  1038      History   Chief Complaint Chief Complaint  Patient presents with  . Chills    11 APPT  . Nausea  . Cough    HPI Paula [name] is a 24 y.o. female.   HPI  URI symptoms and chills x 1 week No wheezing or shortness of breath. No throat pain. Mild diarrhea x few days. Altered taste with nausea no vomiting.  No abdominal pain. 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 Taken Theraflu without improvement of symptoms.  Vaginal discomfort Hx of diabetes. Reports recently changing soaps and taking baths more often and has developed vaginal irritation and burring. No over dysuria, Sexually active w/o known exposure to STI. No abdominal pain.  Past Medical History:  Diagnosis Date  . Asthma   . Diabetes mellitus without complication [facility])     Patient Active Problem List   Diagnosis Date Noted  . Morbid obesity (HCC) 12/17/2017  . Menometrorrhagia 01/19/2014  . General counseling for prescription of oral contraceptives 01/19/2014  . MRSA (methicillin resistant staph aureus) culture positive 09/03/2011  . Candida albicans infection 09/03/2011    Past Surgical History:  Procedure Laterality Date  . GINGIVECTOMY  2010  . TONSILECTOMY, ADENOIDECTOMY, BILATERAL MYRINGOTOMY AND TUBES      OB History    Gravida  0   Para  0   Term  0   Preterm  0   AB  0   Living  0     SAB  0   TAB  0   Ectopic  0   Multiple  0   Live Births               Home Medications    Prior to Admission medications   Medication Sig Start Date End Date Taking? Authorizing Provider  brompheniramine-pseudoephedrine-DM 30-2-10 MG/5ML syrup Take 5 mLs by mouth at bedtime as needed and may repeat dose one time if needed. 10/17/19   Bing Neighbors, FNP  cetirizine (ZYRTEC) 10 MG tablet Take 1 tablet (10 mg total) by mouth at bedtime. 10/17/19   Bing Neighbors,  FNP  fluticasone (FLONASE) 50 MCG/ACT nasal spray Place 2 sprays into both nostrils daily. Patient taking differently: Place 2 sprays into both nostrils daily as needed for allergies.  01/20/19   Hall-Potvin, [place], PA-C  ibuprofen (ADVIL,MOTRIN) 800 MG tablet Take 1 tablet (800 mg total) by mouth every 8 (eight) hours as needed. Patient not taking: Reported on 02/02/2019 01/23/18   Bethann Berkshire, MD  mupirocin ointment (BACTROBAN) 2 % Place 1 application into the nose 2 (two) times daily. 02/02/19   Mannie Stabile, PA-C  neomycin-polymyxin-hydrocortisone (CORTISPORIN) 3.5-10000-1 OTIC suspension Place 4 drops into the left ear 3 (three) times daily. Patient not taking: Reported on 02/02/2019 12/17/18   Hall-Potvin, [place], PA-C  norgestimate-ethinyl estradiol (ORTHO-CYCLEN,SPRINTEC,PREVIFEM) 0.25-35 MG-MCG tablet Take 1 tablet by mouth daily. Patient not taking: Reported on 02/02/2019 10/07/17   Allie Bossier, MD  predniSONE (STERAPRED UNI-PAK 21 TAB) 10 MG (21) TBPK tablet Take by mouth daily. Take as directed. 12/07/19   Mardella Layman, MD    Family History Family History  Problem Relation Age of Onset  . Hypertension Maternal Grandmother   . Diabetes Father     Social History Social History   Tobacco Use  . Smoking status: Never Smoker  . Smokeless  tobacco: Never Used  Vaping Use  . Vaping Use: Never used  Substance Use Topics  . Alcohol use: Yes    Comment: occasionally  . Drug use: No     Allergies   Patient has no known allergies.   Review of Systems Review of Systems Pertinent negatives listed in HPI Physical Exam Triage Vital Signs ED Triage Vitals  Enc Vitals Group     BP 01/23/20 1126 110/69     Pulse Rate 01/23/20 1126 (!) 105     Resp 01/23/20 1126 18     Temp 01/23/20 1126 98.9 F (37.2 C)     Temp Source 01/23/20 1126 Oral     SpO2 01/23/20 1126 98 %     Weight --      Height --      Head Circumference --      Peak Flow --      Pain Score 01/23/20  1125 0     Pain Loc --      Pain Edu? --      Excl. in GC? --    No data found.  Updated Vital Signs BP 110/69 (BP Location: Left Arm)   Pulse (!) 105   Temp 98.9 F (37.2 C) (Oral)   Resp 18   SpO2 98%   Visual Acuity Right Eye Distance:   Left Eye Distance:   Bilateral Distance:    Right Eye Near:   Left Eye Near:    Bilateral Near:     Physical Exam Constitutional:      Appearance: Normal appearance.  HENT:     Head: Normocephalic.     Nose: Nose normal.  Cardiovascular:     Rate and Rhythm: Normal rate and regular rhythm.  Pulmonary:     Effort: Pulmonary effort is normal.     Breath sounds: Normal breath sounds.  Musculoskeletal:        General: Normal range of motion.  Skin:    General: Skin is warm and dry.     Capillary Refill: Capillary refill takes less than 2 seconds.  Neurological:     General: No focal deficit present.     Mental Status: She is alert and oriented to person, place, and time.  Psychiatric:        Mood and Affect: Mood normal.      UC Treatments / Results  Labs (all labs ordered are listed, but only abnormal results are displayed) Labs Reviewed  SARS CORONAVIRUS 2 (TAT 6-24 HRS)  POCT URINALYSIS DIPSTICK, ED / UC  CERVICOVAGINAL ANCILLARY ONLY    EKG   Radiology No results found.  Procedures Procedures (including critical care time)  Medications Ordered in UC Medications - No data to display  Initial Impression / Assessment and Plan / UC Course  I have reviewed the triage vital signs and the nursing notes.  Pertinent labs & imaging results that were available during my care of the patient were reviewed by me and considered in my medical decision making (see chart for details).     Your COVID 19 results will be available in 24-48 hours. Negative results are immediately resulted to Mychart. Positive results will receive a follow-up call from our clinic. Diflucan for vaginitis. Vaginal cytology pending. Final  Clinical Impressions(s) / UC Diagnoses   Final diagnoses:  Encounter for laboratory testing for COVID-19 virus  Vaginitis and vulvovaginitis     Discharge Instructions     [facility] Women's  health clinic [address]  [phone] Open ? Closes 5PM    ED Prescriptions    Medication Sig Dispense Auth. Provider   fluconazole (DIFLUCAN) 150 MG tablet Take 1 tablet (150 mg total) by mouth once for 1 dose. Repeat if needed 2 tablet Bing Neighbors, FNP   ondansetron (ZOFRAN) 4 MG tablet Take 1 tablet (4 mg total) by mouth every 6 (six) hours. 12 tablet Bing Neighbors, FNP   brompheniramine-pseudoephedrine-DM 30-2-10 MG/5ML syrup Take 5 mLs by mouth at bedtime as needed and may repeat dose one time if needed. 120 mL Bing Neighbors, FNP     PDMP not reviewed this encounter.   Bing Neighbors, FNP 01/23/20 1454

## 2020-01-24 ENCOUNTER — Telehealth (HOSPITAL_COMMUNITY): Payer: Self-pay | Admitting: Emergency Medicine

## 2020-01-24 LAB — SARS CORONAVIRUS 2 (TAT 6-24 HRS): SARS Coronavirus 2: POSITIVE — AB

## 2020-01-24 LAB — CERVICOVAGINAL ANCILLARY ONLY
Bacterial Vaginitis (gardnerella): POSITIVE — AB
Candida Glabrata: NEGATIVE
Candida Vaginitis: POSITIVE — AB
Chlamydia: NEGATIVE
Comment: NEGATIVE
Comment: NEGATIVE
Comment: NEGATIVE
Comment: NEGATIVE
Comment: NEGATIVE
Comment: NORMAL
Neisseria Gonorrhea: NEGATIVE
Trichomonas: NEGATIVE

## 2020-01-24 LAB — URINE CULTURE: Culture: >=100000 — AB

## 2020-01-24 MED ORDER — METRONIDAZOLE 500 MG PO TABS: 500.0000 mg | ORAL_TABLET | Freq: Two times a day (BID) | ORAL | 0 refills | Status: DC

## 2020-01-25 ENCOUNTER — Telehealth: Payer: Self-pay | Admitting: Infectious Diseases

## 2020-01-25 NOTE — Telephone Encounter (Signed)
Called to Discuss with patient about Covid symptoms and the use of the monoclonal antibody infusion for those with mild to moderate Covid symptoms and at a high risk of hospitalization.     Pt appears to qualify for this infusion due to co-morbid conditions and/or a member of an at-risk group in accordance with the FDA Emergency Use Authorization.    Sx started last Monday 9/13. She is on day 10 today and would like to research on her own before committing.   She has my work cell for schedule today if needed. Unable to schedule beyond 9/22

## 2020-03-28 ENCOUNTER — Ambulatory Visit (HOSPITAL_COMMUNITY): Payer: Self-pay

## 2020-04-18 IMAGING — DX DG CHEST 2V
2 series · 2 of 2 positions shown · non-contrast
Comparison: None.

CLINICAL DATA: Productive cough, emesis

EXAM:
CHEST - 2 VIEW

[chest pa]
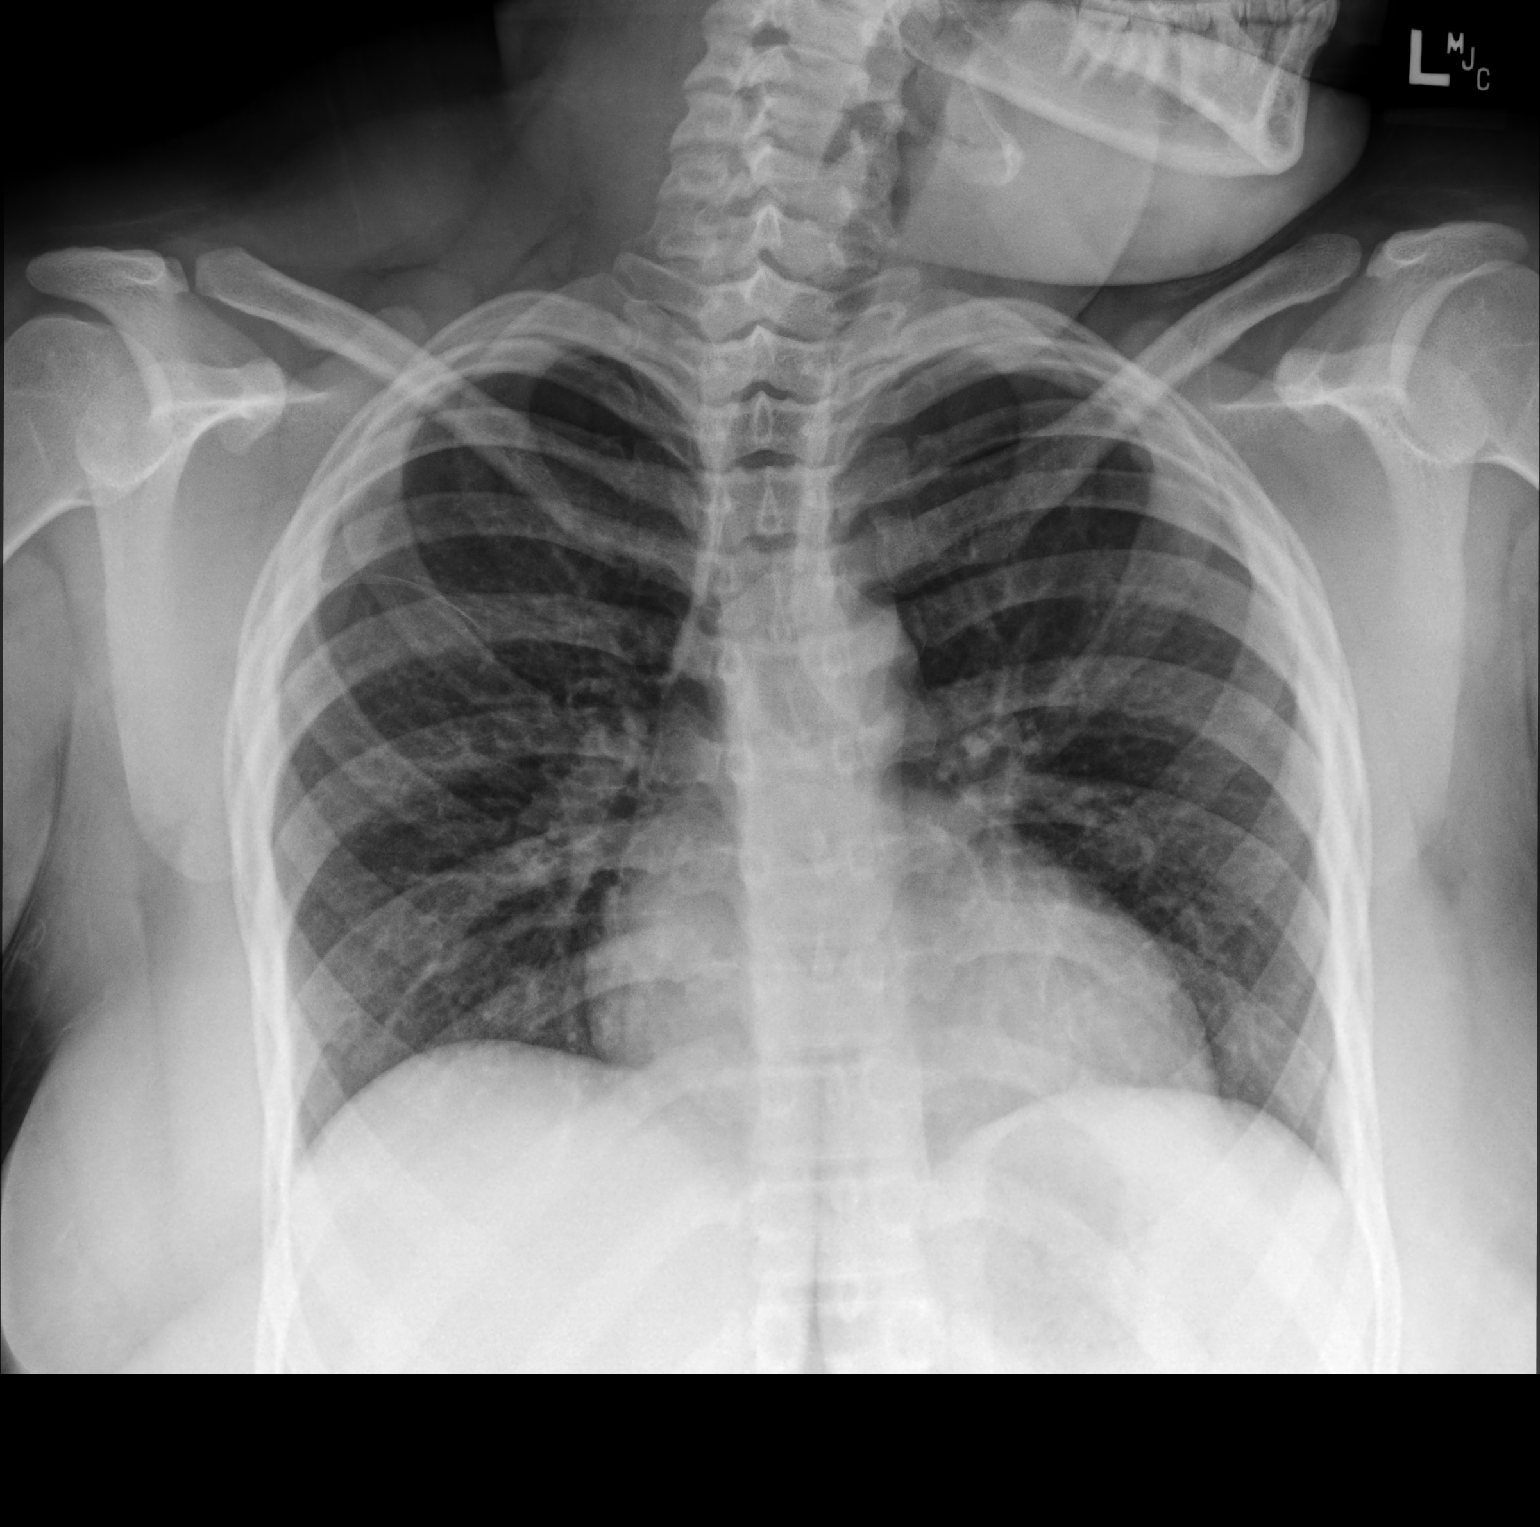

[chest lat]
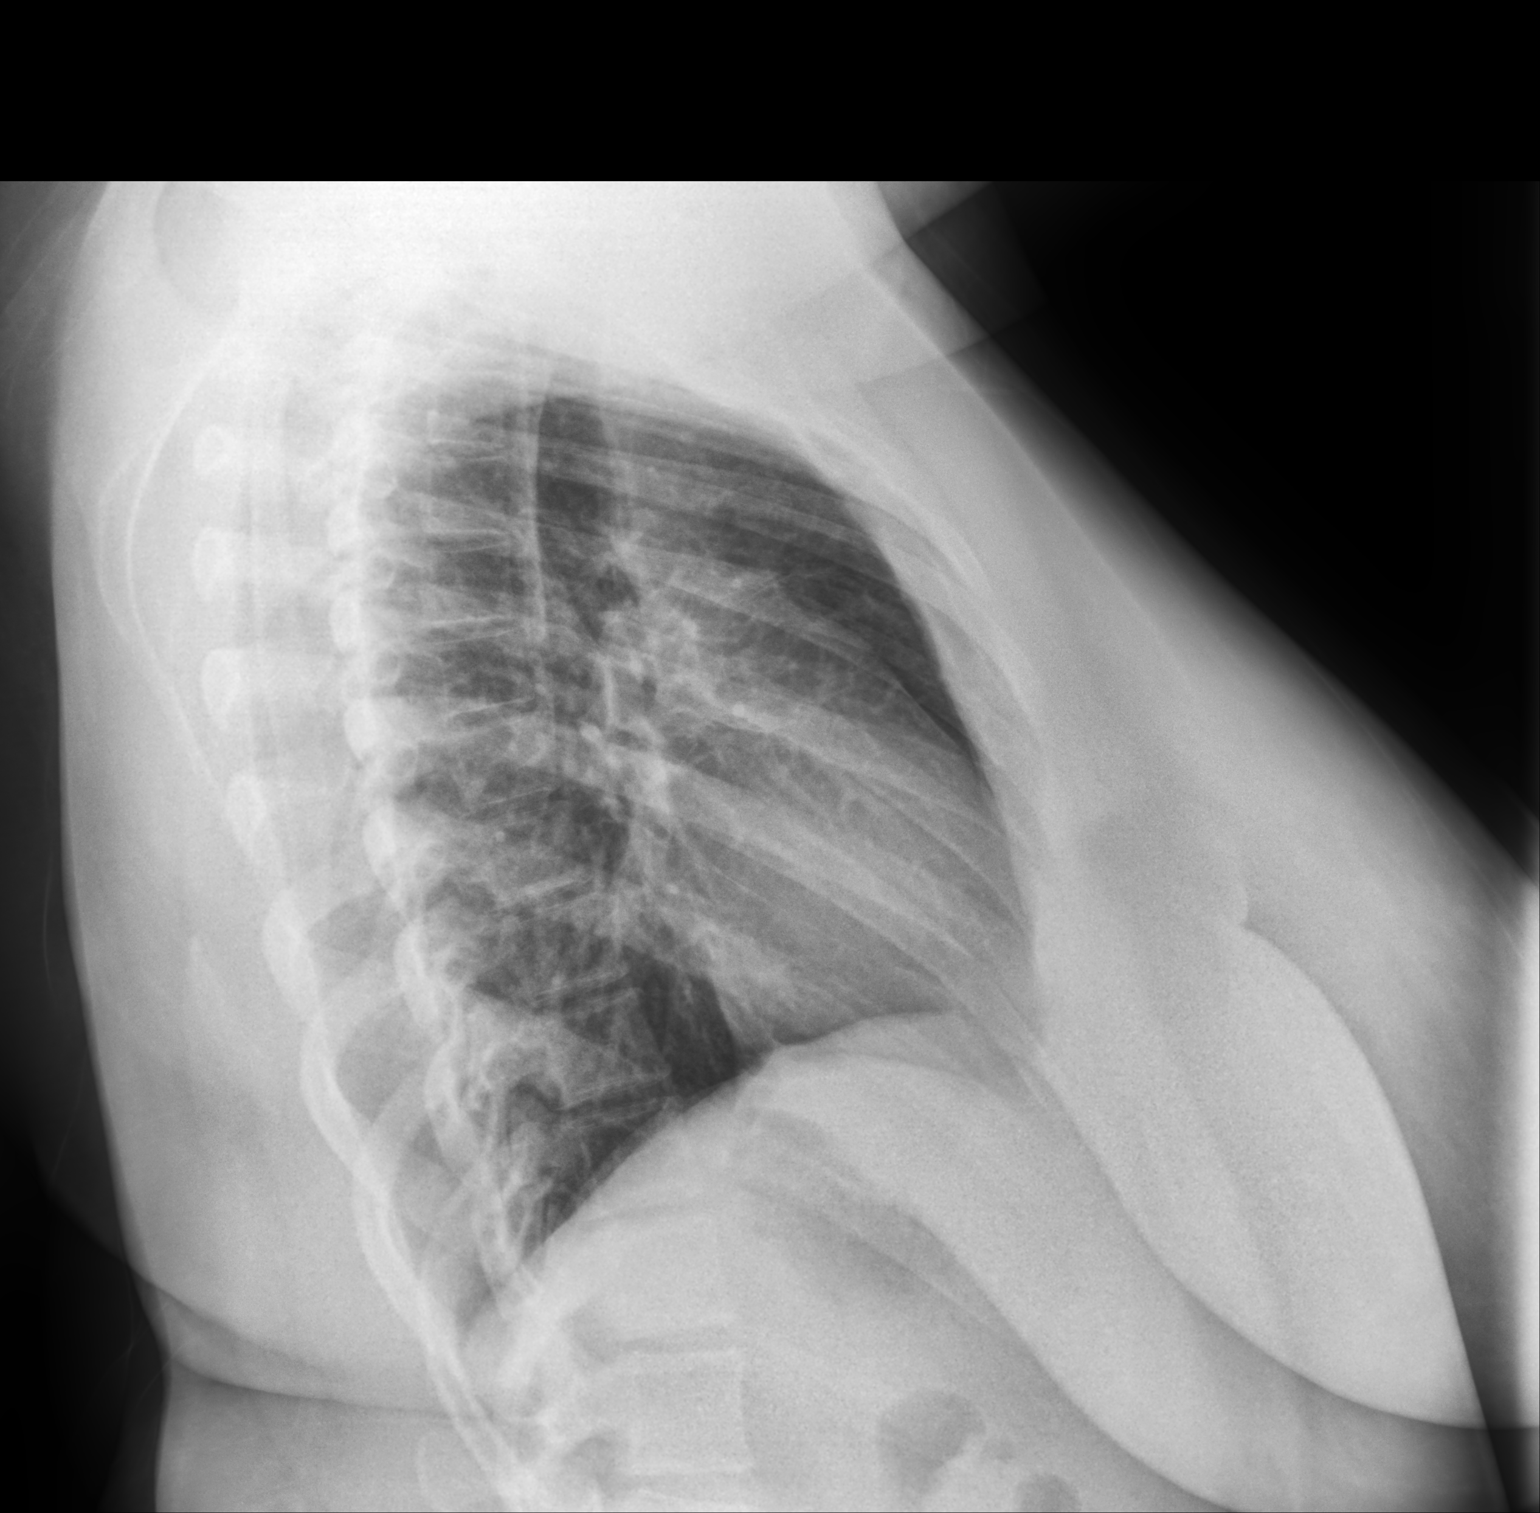

[2 of 2 positions shown; findings below may reference images not displayed]

FINDINGS: The heart size and mediastinal contours are within normal limits.
Both lungs are clear. The visualized skeletal structures are
unremarkable.
IMPRESSION: No acute abnormality of the lungs.

## 2020-05-29 ENCOUNTER — Ambulatory Visit (HOSPITAL_COMMUNITY): Payer: Self-pay

## 2020-06-05 ENCOUNTER — Ambulatory Visit (HOSPITAL_COMMUNITY): Payer: Self-pay

## 2020-06-06 ENCOUNTER — Ambulatory Visit (HOSPITAL_COMMUNITY): Payer: Self-pay

## 2020-06-11 ENCOUNTER — Ambulatory Visit (HOSPITAL_COMMUNITY): Payer: Self-pay

## 2020-07-17 ENCOUNTER — Encounter (HOSPITAL_COMMUNITY): Payer: Self-pay | Admitting: Emergency Medicine

## 2020-07-17 ENCOUNTER — Ambulatory Visit (HOSPITAL_COMMUNITY)
Admission: EM | Admit: 2020-07-17 | Discharge: 2020-07-17 | Disposition: A | Discharge status: Home / Self Care | Payer: Medicaid Other | Attending: Student | Admitting: Student

## 2020-07-17 ENCOUNTER — Other Ambulatory Visit: Payer: Self-pay

## 2020-07-17 DIAGNOSIS — Z113 Encounter for screening for infections with a predominantly sexual mode of transmission: Secondary | ICD-10-CM | POA: Diagnosis present

## 2020-07-17 DIAGNOSIS — Z3202 Encounter for pregnancy test, result negative: Secondary | ICD-10-CM | POA: Diagnosis present

## 2020-07-17 LAB — POC URINE PREG, ED: Preg Test, Ur: NEGATIVE

## 2020-07-17 NOTE — ED Notes (Signed)
Patient requested water to drink, provided

## 2020-07-17 NOTE — ED Triage Notes (Signed)
Patient requesting pregnancy test, and std test.  Patient denies symptoms or particular concerns, just wanting routine std testing

## 2020-07-17 NOTE — ED Provider Notes (Signed)
[facility]    CSN: [id number] Arrival date & time: 07/17/20  1659      History   Chief Complaint Chief Complaint  Patient presents with  . SEXUALLY TRANSMITTED DISEASE    HPI ALYRIC PARKIN is a 25 y.o. female presenting for STI and pregnancy testing.  History of asthma, diabetes, morbid obesity, MRSA, vaginal Candida.  Endorses occasional white discharge with odor but denies any symptoms currently. Denies hematuria, dysuria, frequency, urgency, back pain, n/v/d/abd pain, fevers/chills, abdnormal vaginal discharge, vaginal lesions, new partners.   HPI  Past Medical History:  Diagnosis Date  . Asthma   . Diabetes mellitus without complication [facility])     Patient Active Problem List   Diagnosis Date Noted  . Morbid obesity (HCC) 12/17/2017  . Menometrorrhagia 01/19/2014  . General counseling for prescription of oral contraceptives 01/19/2014  . MRSA (methicillin resistant staph aureus) culture positive 09/03/2011  . Candida albicans infection 09/03/2011    Past Surgical History:  Procedure Laterality Date  . GINGIVECTOMY  2010  . TONSILECTOMY, ADENOIDECTOMY, BILATERAL MYRINGOTOMY AND TUBES      OB History    Gravida  0   Para  0   Term  0   Preterm  0   AB  0   Living  0     SAB  0   IAB  0   Ectopic  0   Multiple  0   Live Births               Home Medications    Prior to Admission medications   Medication Sig Start Date End Date Taking? Authorizing Provider  brompheniramine-pseudoephedrine-DM 30-2-10 MG/5ML syrup Take 5 mLs by mouth at bedtime as needed and may repeat dose one time if needed. 01/23/20   Bing Neighbors, FNP  cetirizine (ZYRTEC) 10 MG tablet Take 1 tablet (10 mg total) by mouth at bedtime. 10/17/19   Bing Neighbors, FNP  fluticasone (FLONASE) 50 MCG/ACT nasal spray Place 2 sprays into both nostrils daily. Patient taking differently: Place 2 sprays into both nostrils daily as needed for allergies. 01/20/19    Hall-Potvin, [place], PA-C  ibuprofen (ADVIL,MOTRIN) 800 MG tablet Take 1 tablet (800 mg total) by mouth every 8 (eight) hours as needed. Patient not taking: Reported on 02/02/2019 01/23/18   Bethann Berkshire, MD  metroNIDAZOLE (FLAGYL) 500 MG tablet Take 1 tablet (500 mg total) by mouth 2 (two) times daily. 01/24/20   LampteyBritta Mccreedy, MD  mupirocin ointment (BACTROBAN) 2 % Place 1 application into the nose 2 (two) times daily. 02/02/19   Mannie Stabile, PA-C  neomycin-polymyxin-hydrocortisone (CORTISPORIN) 3.5-10000-1 OTIC suspension Place 4 drops into the left ear 3 (three) times daily. Patient not taking: No sig reported 12/17/18   Hall-Potvin, [place], PA-C  norgestimate-ethinyl estradiol (ORTHO-CYCLEN,SPRINTEC,PREVIFEM) 0.25-35 MG-MCG tablet Take 1 tablet by mouth daily. Patient not taking: No sig reported 10/07/17   Allie Bossier, MD  ondansetron (ZOFRAN) 4 MG tablet Take 1 tablet (4 mg total) by mouth every 6 (six) hours. 01/23/20   Bing Neighbors, FNP  predniSONE (STERAPRED UNI-PAK 21 TAB) 10 MG (21) TBPK tablet Take by mouth daily. Take as directed. 12/07/19   Mardella Layman, MD    Family History Family History  Problem Relation Age of Onset  . Hypertension Maternal Grandmother   . Diabetes Father     Social History Social History   Tobacco Use  . Smoking status: Never Smoker  . Smokeless tobacco:  Never Used  Vaping Use  . Vaping Use: Never used  Substance Use Topics  . Alcohol use: Yes    Comment: occasionally  . Drug use: No     Allergies   Patient has no known allergies.   Review of Systems Review of Systems  Constitutional: Negative for chills and fever.  HENT: Negative for sore throat.   Eyes: Negative for pain and redness.  Respiratory: Negative for shortness of breath.   Cardiovascular: Negative for chest pain.  Gastrointestinal: Negative for abdominal pain, diarrhea, nausea and vomiting.  Genitourinary: Negative for decreased urine volume, difficulty  urinating, dysuria, flank pain, frequency, genital sores, hematuria, menstrual problem, pelvic pain, urgency, vaginal bleeding, vaginal discharge and vaginal pain.  Musculoskeletal: Negative for back pain.  Skin: Negative for rash.  All other systems reviewed and are negative.    Physical Exam Triage Vital Signs ED Triage Vitals  Enc Vitals Group     BP 07/17/20 1737 117/89     Pulse Rate 07/17/20 1737 87     Resp 07/17/20 1737 20     Temp 07/17/20 1737 98.8 F (37.1 C)     Temp Source 07/17/20 1737 Oral     SpO2 07/17/20 1737 100 %     Weight --      Height --      Head Circumference --      Peak Flow --      Pain Score 07/17/20 1734 0     Pain Loc --      Pain Edu? --      Excl. in GC? --    No data found.  Updated Vital Signs BP 117/89 (BP Location: Left Arm) Comment: large cuff  Pulse 87   Temp 98.8 F (37.1 C) (Oral)   Resp 20   LMP 07/09/2020   SpO2 100%   Visual Acuity Right Eye Distance:   Left Eye Distance:   Bilateral Distance:    Right Eye Near:   Left Eye Near:    Bilateral Near:     Physical Exam Vitals reviewed.  Constitutional:      General: She is not in acute distress.    Appearance: Normal appearance. She is not ill-appearing.  HENT:     Head: Normocephalic and atraumatic.     Mouth/Throat:     Mouth: Mucous membranes are moist.     Comments: Moist mucous membranes Eyes:     Extraocular Movements: Extraocular movements intact.     Pupils: Pupils are equal, round, and reactive to light.  Cardiovascular:     Rate and Rhythm: Normal rate and regular rhythm.     Heart sounds: Normal heart sounds.  Pulmonary:     Effort: Pulmonary effort is normal.     Breath sounds: Normal breath sounds. No wheezing, rhonchi or rales.  Abdominal:     General: Bowel sounds are normal. There is no distension.     Palpations: Abdomen is soft. There is no mass.     Tenderness: There is no abdominal tenderness. There is no right CVA tenderness, left CVA  tenderness, guarding or rebound.  Skin:    General: Skin is warm.     Capillary Refill: Capillary refill takes less than 2 seconds.     Comments: Good skin turgor  Neurological:     General: No focal deficit present.     Mental Status: She is alert and oriented to person, place, and time.  Psychiatric:  Mood and Affect: Mood normal.        Behavior: Behavior normal.      UC Treatments / Results  Labs (all labs ordered are listed, but only abnormal results are displayed) Labs Reviewed  POC URINE PREG, ED  CERVICOVAGINAL ANCILLARY ONLY    EKG   Radiology No results found.  Procedures Procedures (including critical care time)  Medications Ordered in UC Medications - No data to display  Initial Impression / Assessment and Plan / UC Course  I have reviewed the triage vital signs and the nursing notes.  Pertinent labs & imaging results that were available during my care of the patient were reviewed by me and considered in my medical decision making (see chart for details).     This patient is a 25 year old female presenting for routine STI screening and pregnancy test.  Today she is afebrile, nontachycardic, nontachypneic with no abdominal pain or vaginal symptoms.  We will send gonorrhea, chlamydia, trichomonas, BV, yeast testing.  Abstain until negative result.  Could send treatment if necessary.  She declines HIV, syphilis.  Urine pregnancy negative today No PCP; referral placed and information provided. Return precautions discussed.  This chart was dictated using voice recognition software, Dragon. Despite the best efforts of this provider to proofread and correct errors, errors may still occur which can change documentation meaning.   Final Clinical Impressions(s) / UC Diagnoses   Final diagnoses:  Routine screening for STI (sexually transmitted infection)  Negative pregnancy test     Discharge Instructions     -We are screening you for gonorrhea,  chlamydia, trichomonas, BV, yeast today.  If any of these is positive, we can send over treatment at that time.  The result should come back about 1 to 2 days. -If you develop new symptoms like abdominal pain, new/worsening vaginal discharge, back pain, pain with urinating, unusual rashes or lesions- seek additional medical treatment.  -I placed a referral for you to establish care with a primary care.  They should reach out to you in the next week or so for this.  If they do not, I also provided information for a primary care that you can call below.    ED Prescriptions    None     PDMP not reviewed this encounter.   Rhys Martini, PA-C 07/17/20 1809

## 2020-07-17 NOTE — Discharge Instructions (Addendum)
-  We are screening you for gonorrhea, chlamydia, trichomonas, BV, yeast today.  If any of these is positive, we can send over treatment at that time.  The result should come back about 1 to 2 days. -If you develop new symptoms like abdominal pain, new/worsening vaginal discharge, back pain, pain with urinating, unusual rashes or lesions- seek additional medical treatment.  -I placed a referral for you to establish care with a primary care.  They should reach out to you in the next week or so for this.  If they do not, I also provided information for a primary care that you can call below.

## 2020-07-18 ENCOUNTER — Ambulatory Visit (HOSPITAL_COMMUNITY): Payer: Self-pay

## 2020-07-19 ENCOUNTER — Telehealth (HOSPITAL_COMMUNITY): Payer: Self-pay | Admitting: Emergency Medicine

## 2020-07-19 LAB — CERVICOVAGINAL ANCILLARY ONLY
Bacterial Vaginitis (gardnerella): POSITIVE — AB
Candida Glabrata: NEGATIVE
Candida Vaginitis: POSITIVE — AB
Chlamydia: NEGATIVE
Comment: NEGATIVE
Comment: NEGATIVE
Comment: NEGATIVE
Comment: NEGATIVE
Comment: NEGATIVE
Comment: NORMAL
Neisseria Gonorrhea: NEGATIVE
Trichomonas: NEGATIVE

## 2020-07-19 MED ORDER — FLUCONAZOLE 150 MG PO TABS: 150.0000 mg | ORAL_TABLET | Freq: Once | ORAL | 0 refills | Status: AC

## 2020-07-19 MED ORDER — METRONIDAZOLE 500 MG PO TABS: 500.0000 mg | ORAL_TABLET | Freq: Two times a day (BID) | ORAL | 0 refills | Status: DC

## 2020-08-20 ENCOUNTER — Ambulatory Visit (HOSPITAL_COMMUNITY): Payer: Self-pay

## 2020-08-21 ENCOUNTER — Ambulatory Visit (HOSPITAL_COMMUNITY): Payer: Self-pay

## 2020-08-24 ENCOUNTER — Ambulatory Visit (HOSPITAL_COMMUNITY): Payer: Self-pay

## 2020-08-31 DIAGNOSIS — N62 Hypertrophy of breast: Secondary | ICD-10-CM | POA: Insufficient documentation

## 2021-01-16 ENCOUNTER — Ambulatory Visit: Payer: Medicaid Other

## 2021-04-08 ENCOUNTER — Other Ambulatory Visit: Payer: Self-pay

## 2021-04-08 ENCOUNTER — Ambulatory Visit
Admission: RE | Admit: 2021-04-08 | Discharge: 2021-04-08 | Disposition: A | Discharge status: Home / Self Care | Payer: Medicaid Other | Source: Ambulatory Visit | Attending: Physician Assistant | Admitting: Physician Assistant

## 2021-04-08 VITALS — BP 136/88 | HR 79 | Temp 98.5°F | Resp 18 | Ht 64.0 in | Wt 240.0 lb

## 2021-04-08 DIAGNOSIS — R0789 Other chest pain: Secondary | ICD-10-CM | POA: Diagnosis not present

## 2021-04-08 MED ORDER — CYCLOBENZAPRINE HCL 10 MG PO TABS: 10.0000 mg | ORAL_TABLET | Freq: Two times a day (BID) | ORAL | 0 refills | Status: DC | PRN

## 2021-04-08 MED ORDER — PREDNISONE 20 MG PO TABS: 40.0000 mg | ORAL_TABLET | Freq: Every day | ORAL | 0 refills | Status: AC

## 2021-04-08 NOTE — ED Triage Notes (Signed)
Pt c/o sharp pain in chest since 03/31/21 after getting out of her vehicle. Pt states that it feels like something is sitting on her chest. Pt denies physical activity. Pt states that it hurts currently like she got punched in her chest.

## 2021-04-08 NOTE — ED Provider Notes (Signed)
[name]    CSN: [id number] Arrival date & time: 04/08/21  1556      History   Chief Complaint No chief complaint on file.   HPI Paula [name] is a 25 y.o. female.   Patient here today for evaluation of left-sided and mid chest pain that started several days ago.  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  She denies any associated shortness of breath.  She states that she has had some tenderness to palpation in area of pain.  Movement of her left arm has caused more significant pain at times.  The history is provided by the patient.   Past Medical History:  Diagnosis Date   Asthma    Diabetes mellitus without complication [facility])     Patient Active Problem List   Diagnosis Date Noted   Morbid obesity (HCC) 12/17/2017   Menometrorrhagia 01/19/2014   General counseling for prescription of oral contraceptives 01/19/2014   MRSA (methicillin resistant staph aureus) culture positive 09/03/2011   Candida albicans infection 09/03/2011    Past Surgical History:  Procedure Laterality Date   GINGIVECTOMY  2010   TONSILECTOMY, ADENOIDECTOMY, BILATERAL MYRINGOTOMY AND TUBES      OB History     Gravida  0   Para  0   Term  0   Preterm  0   AB  0   Living  0      SAB  0   IAB  0   Ectopic  0   Multiple  0   Live Births               Home Medications    Prior to Admission medications   Medication Sig Start Date End Date Taking? Authorizing Provider  cetirizine (ZYRTEC) 10 MG tablet Take 1 tablet (10 mg total) by mouth at bedtime. 10/17/19  Yes Bing Neighbors, FNP  cyclobenzaprine (FLEXERIL) 10 MG tablet Take 1 tablet (10 mg total) by mouth 2 (two) times daily as needed for muscle spasms. 04/08/21  Yes Tomi Bamberger, PA-C  ibuprofen (ADVIL,MOTRIN) 800 MG tablet Take 1 tablet (800 mg total) by mouth every 8  (eight) hours as needed. 01/23/18  Yes Bethann Berkshire, MD  metroNIDAZOLE (FLAGYL) 500 MG tablet Take 1 tablet (500 mg total) by mouth 2 (two) times daily. 07/19/20  Yes Lamptey, Britta Mccreedy, MD  neomycin-polymyxin-hydrocortisone (CORTISPORIN) 3.5-10000-1 OTIC suspension Place 4 drops into the left ear 3 (three) times daily. 12/17/18  Yes Hall-Potvin, [place], PA-C  predniSONE (DELTASONE) 20 MG tablet Take 2 tablets (40 mg total) by mouth daily with breakfast for 5 days. 04/08/21 04/13/21 Yes Tomi Bamberger, PA-C  brompheniramine-pseudoephedrine-DM 30-2-10 MG/5ML syrup Take 5 mLs by mouth at bedtime as needed and may repeat dose one time if needed. 01/23/20   Bing Neighbors, FNP  fluticasone (FLONASE) 50 MCG/ACT nasal spray Place 2 sprays into both nostrils daily. Patient taking differently: Place 2 sprays into both nostrils daily as needed for allergies. 01/20/19   Hall-Potvin, [place], PA-C  mupirocin ointment (BACTROBAN) 2 % Place 1 application into the nose 2 (two) times daily. 02/02/19   Mannie Stabile, PA-C  norgestimate-ethinyl estradiol (ORTHO-CYCLEN,SPRINTEC,PREVIFEM) 0.25-35 MG-MCG tablet Take 1 tablet by mouth daily. 10/07/17   Allie Bossier, MD  ondansetron (ZOFRAN) 4 MG tablet Take 1 tablet (  4 mg total) by mouth every 6 (six) hours. 01/23/20   Bing Neighbors, FNP    Family History Family History  Problem Relation Age of Onset   Hypertension Maternal Grandmother    Diabetes Father     Social History Social History   Tobacco Use   Smoking status: Never   Smokeless tobacco: Never  Vaping Use   Vaping Use: Never used  Substance Use Topics   Alcohol use: Yes    Comment: occasionally   Drug use: No     Allergies   Patient has no known allergies.   Review of Systems Review of Systems  Constitutional:  Negative for chills and fever.  Eyes:  Negative for discharge and redness.  Respiratory:  Negative for shortness of breath.   Cardiovascular:  Positive for chest pain.   Gastrointestinal:  Negative for abdominal pain, nausea and vomiting.  Genitourinary:  Positive for vaginal bleeding and vaginal discharge.    Physical Exam Triage Vital Signs ED Triage Vitals  Enc Vitals Group     BP 04/08/21 1639 136/88     Pulse Rate 04/08/21 1639 79     Resp 04/08/21 1639 18     Temp 04/08/21 1639 98.5 F (36.9 C)     Temp Source 04/08/21 1639 Oral     SpO2 04/08/21 1639 97 %     Weight 04/08/21 1634 240 lb (108.9 kg)     Height 04/08/21 1634 5\' 4"  (1.626 m)     Head Circumference --      Peak Flow --      Pain Score 04/08/21 1631 6     Pain Loc --      Pain Edu? --      Excl. in GC? --    No data found.  Updated Vital Signs BP 136/88 (BP Location: Left Arm)   Pulse 79   Temp 98.5 F (36.9 C) (Oral)   Resp 18   Ht 5\' 4"  (1.626 m)   Wt 240 lb (108.9 kg)   LMP 04/08/2021   SpO2 97%   BMI 41.20 kg/m      Physical Exam Vitals and nursing note reviewed.  Constitutional:      General: She is not in acute distress.    Appearance: Normal appearance. She is not ill-appearing.  HENT:     Head: Normocephalic and atraumatic.  Eyes:     Conjunctiva/sclera: Conjunctivae normal.  Cardiovascular:     Rate and Rhythm: Normal rate and regular rhythm.     Heart sounds: Normal heart sounds. No murmur heard. Pulmonary:     Effort: Pulmonary effort is normal. No respiratory distress.     Breath sounds: No wheezing or rales.  Chest:     Chest wall: Tenderness (left sided) present.  Neurological:     Mental Status: She is alert.  Psychiatric:        Mood and Affect: Mood normal.        Behavior: Behavior normal.        Thought Content: Thought content normal.     UC Treatments / Results  Labs (all labs ordered are listed, but only abnormal results are displayed) Labs Reviewed - No data to display  EKG   Radiology No results found.  Procedures Procedures (including critical care time)  Medications Ordered in UC Medications - No data to  display  Initial Impression / Assessment and Plan / UC Course  I have reviewed the triage vital signs and the nursing  notes.  Pertinent labs & imaging results that were available during my care of the patient were reviewed by me and considered in my medical decision making (see chart for details).   Suspect likely muscular strain-- recommended prednisone as well as muscle relaxer. EKG in office with NSR, no concerning findings. Encouraged follow up with PCP. Recommend ED with any worsening pain or other concerning symptoms.   Final Clinical Impressions(s) / UC Diagnoses   Final diagnoses:  Chest pain, atypical   Discharge Instructions   None    ED Prescriptions     Medication Sig Dispense Auth. Provider   predniSONE (DELTASONE) 20 MG tablet Take 2 tablets (40 mg total) by mouth daily with breakfast for 5 days. 10 tablet Erma Pinto F, PA-C   cyclobenzaprine (FLEXERIL) 10 MG tablet Take 1 tablet (10 mg total) by mouth 2 (two) times daily as needed for muscle spasms. 20 tablet Tomi Bamberger, PA-C      PDMP not reviewed this encounter.   Tomi Bamberger, PA-C 04/08/21 1730

## 2021-04-08 NOTE — ED Notes (Signed)
Scheduled patient new PCP appointment prior to leaving

## 2021-04-18 ENCOUNTER — Ambulatory Visit (HOSPITAL_COMMUNITY): Payer: Medicaid Other

## 2021-05-06 ENCOUNTER — Other Ambulatory Visit: Payer: Self-pay

## 2021-05-06 ENCOUNTER — Ambulatory Visit
Admission: RE | Admit: 2021-05-06 | Discharge: 2021-05-06 | Disposition: A | Discharge status: Home / Self Care | Payer: Medicaid Other | Source: Ambulatory Visit | Attending: Physician Assistant | Admitting: Physician Assistant

## 2021-05-06 VITALS — BP 122/79 | HR 82 | Temp 98.4°F | Resp 16

## 2021-05-06 DIAGNOSIS — N760 Acute vaginitis: Secondary | ICD-10-CM | POA: Diagnosis present

## 2021-05-06 NOTE — ED Provider Notes (Signed)
[name]    CSN: [id number] Arrival date & time: 05/06/21  1705      History   Chief Complaint Chief Complaint  Patient presents with   Appointment   Vaginitis    HPI Paula [name] is a 26 y.o. female.   Patient here today for evaluation of vaginal burning, itching and odor that started when she started her menstrual period. She reports she is about half way through her period at this time. She is concerned she may have BV or yeast infection. She does not report any other concerns at this time.  The history is provided by the patient.   Past Medical History:  Diagnosis Date   Asthma    Diabetes mellitus without complication [facility])     Patient Active Problem List   Diagnosis Date Noted   Morbid obesity (HCC) 12/17/2017   Menometrorrhagia 01/19/2014   General counseling for prescription of oral contraceptives 01/19/2014   MRSA (methicillin resistant staph aureus) culture positive 09/03/2011   Candida albicans infection 09/03/2011    Past Surgical History:  Procedure Laterality Date   GINGIVECTOMY  2010   TONSILECTOMY, ADENOIDECTOMY, BILATERAL MYRINGOTOMY AND TUBES      OB History     Gravida  0   Para  0   Term  0   Preterm  0   AB  0   Living  0      SAB  0   IAB  0   Ectopic  0   Multiple  0   Live Births               Home Medications    Prior to Admission medications   Medication Sig Start Date End Date Taking? Authorizing Provider  brompheniramine-pseudoephedrine-DM 30-2-10 MG/5ML syrup Take 5 mLs by mouth at bedtime as needed and may repeat dose one time if needed. 01/23/20   Bing Neighbors, FNP  cetirizine (ZYRTEC) 10 MG tablet Take 1 tablet (10 mg total) by mouth at bedtime. 10/17/19   Bing Neighbors, FNP  cyclobenzaprine (FLEXERIL) 10 MG tablet Take 1 tablet (10 mg total) by mouth 2 (two) times daily as needed for muscle spasms. 04/08/21   Tomi Bamberger, PA-C  fluticasone (FLONASE) 50 MCG/ACT nasal spray  Place 2 sprays into both nostrils daily. Patient taking differently: Place 2 sprays into both nostrils daily as needed for allergies. 01/20/19   Hall-Potvin, [place], PA-C  ibuprofen (ADVIL,MOTRIN) 800 MG tablet Take 1 tablet (800 mg total) by mouth every 8 (eight) hours as needed. 01/23/18   Bethann Berkshire, MD  metroNIDAZOLE (FLAGYL) 500 MG tablet Take 1 tablet (500 mg total) by mouth 2 (two) times daily. 07/19/20   Lamptey, Britta Mccreedy, MD  mupirocin ointment (BACTROBAN) 2 % Place 1 application into the nose 2 (two) times daily. 02/02/19   Mannie Stabile, PA-C  neomycin-polymyxin-hydrocortisone (CORTISPORIN) 3.5-10000-1 OTIC suspension Place 4 drops into the left ear 3 (three) times daily. 12/17/18   Hall-Potvin, [place], PA-C  norgestimate-ethinyl estradiol (ORTHO-CYCLEN,SPRINTEC,PREVIFEM) 0.25-35 MG-MCG tablet Take 1 tablet by mouth daily. 10/07/17   Allie Bossier, MD  ondansetron (ZOFRAN) 4 MG tablet Take 1 tablet (4 mg total) by mouth every 6 (six) hours. 01/23/20   Bing Neighbors, FNP    Family History Family History  Problem Relation Age of Onset   Hypertension Maternal Grandmother    Diabetes Father     Social History Social History   Tobacco Use   Smoking status:  Never   Smokeless tobacco: Never  Vaping Use   Vaping Use: Never used  Substance Use Topics   Alcohol use: Yes    Comment: occasionally   Drug use: No     Allergies   Patient has no known allergies.   Review of Systems Review of Systems  Constitutional:  Negative for chills and fever.  Eyes:  Negative for discharge and redness.  Gastrointestinal:  Negative for abdominal pain, nausea and vomiting.  Genitourinary:  Positive for vaginal bleeding, vaginal discharge and vaginal pain (burning).    Physical Exam Triage Vital Signs ED Triage Vitals  Enc Vitals Group     BP 05/06/21 1731 122/79     Pulse Rate 05/06/21 1731 82     Resp 05/06/21 1731 16     Temp 05/06/21 1731 98.4 F (36.9 C)     Temp  Source 05/06/21 1731 Oral     SpO2 05/06/21 1731 99 %     Weight --      Height --      Head Circumference --      Peak Flow --      Pain Score 05/06/21 1732 0     Pain Loc --      Pain Edu? --      Excl. in GC? --    No data found.  Updated Vital Signs BP 122/79 (BP Location: Left Arm)    Pulse 82    Temp 98.4 F (36.9 C) (Oral)    Resp 16    LMP 04/08/2021    SpO2 99%      Physical Exam Vitals and nursing note reviewed.  Constitutional:      General: She is not in acute distress.    Appearance: Normal appearance. She is not ill-appearing.  HENT:     Head: Normocephalic and atraumatic.  Eyes:     Conjunctiva/sclera: Conjunctivae normal.  Cardiovascular:     Rate and Rhythm: Normal rate.  Pulmonary:     Effort: Pulmonary effort is normal.  Neurological:     Mental Status: She is alert.  Psychiatric:        Mood and Affect: Mood normal.        Behavior: Behavior normal.        Thought Content: Thought content normal.     UC Treatments / Results  Labs (all labs ordered are listed, but only abnormal results are displayed) Labs Reviewed  CERVICOVAGINAL ANCILLARY ONLY    EKG   Radiology No results found.  Procedures Procedures (including critical care time)  Medications Ordered in UC Medications - No data to display  Initial Impression / Assessment and Plan / UC Course  I have reviewed the triage vital signs and the nursing notes.  Pertinent labs & imaging results that were available during my care of the patient were reviewed by me and considered in my medical decision making (see chart for details).    Screening ordered for yeast, BV as well as gonorrhea and chlamydia. Recommend follow up with any concerns while awaiting results.  Final Clinical Impressions(s) / UC Diagnoses   Final diagnoses:  Acute vaginitis   Discharge Instructions   None    ED Prescriptions   None    PDMP not reviewed this encounter.   Tomi Bamberger, PA-C 05/06/21  1820

## 2021-05-06 NOTE — ED Triage Notes (Signed)
Vaginal burning, itching, odor since starting period. Believes she has either BV or yeast infection. Is currently on her period.

## 2021-05-08 LAB — CERVICOVAGINAL ANCILLARY ONLY
Bacterial Vaginitis (gardnerella): POSITIVE — AB
Candida Glabrata: NEGATIVE
Candida Vaginitis: NEGATIVE
Chlamydia: NEGATIVE
Comment: NEGATIVE
Comment: NEGATIVE
Comment: NEGATIVE
Comment: NEGATIVE
Comment: NEGATIVE
Comment: NORMAL
Neisseria Gonorrhea: NEGATIVE
Trichomonas: NEGATIVE

## 2021-05-09 ENCOUNTER — Telehealth (HOSPITAL_COMMUNITY): Payer: Self-pay

## 2021-05-09 MED ORDER — METRONIDAZOLE 500 MG PO TABS: 500.0000 mg | ORAL_TABLET | Freq: Two times a day (BID) | ORAL | 0 refills | Status: DC

## 2021-05-29 ENCOUNTER — Ambulatory Visit: Payer: Medicaid Other | Admitting: Family Medicine

## 2021-12-10 ENCOUNTER — Ambulatory Visit: Payer: Self-pay

## 2021-12-11 ENCOUNTER — Ambulatory Visit: Payer: Self-pay

## 2022-01-07 ENCOUNTER — Other Ambulatory Visit (INDEPENDENT_AMBULATORY_CARE_PROVIDER_SITE_OTHER): Payer: Self-pay | Admitting: Family Medicine

## 2022-01-08 ENCOUNTER — Ambulatory Visit
Admission: RE | Admit: 2022-01-08 | Discharge: 2022-01-08 | Disposition: A | Discharge status: Home / Self Care | Payer: Medicaid Other | Source: Ambulatory Visit | Attending: Physician Assistant | Admitting: Physician Assistant

## 2022-01-08 ENCOUNTER — Ambulatory Visit: Payer: Medicaid Other

## 2022-01-08 VITALS — BP 138/75 | HR 86 | Temp 98.3°F | Resp 18

## 2022-01-08 DIAGNOSIS — J45901 Unspecified asthma with (acute) exacerbation: Secondary | ICD-10-CM

## 2022-01-08 MED ORDER — ALBUTEROL SULFATE HFA 108 (90 BASE) MCG/ACT IN AERS: 1.0000 | INHALATION_SPRAY | Freq: Four times a day (QID) | RESPIRATORY_TRACT | 0 refills | Status: DC | PRN

## 2022-01-08 MED ORDER — PREDNISONE 20 MG PO TABS: 40.0000 mg | ORAL_TABLET | Freq: Every day | ORAL | 0 refills | Status: AC

## 2022-01-08 NOTE — ED Triage Notes (Signed)
Pt presents with non productive cough X 1 month she believes to be associated with her asthma.

## 2022-01-08 NOTE — ED Provider Notes (Signed)
[name]    CSN: [id number] Arrival date & time: 01/08/22  1446      History   Chief Complaint Chief Complaint  Patient presents with   Cough    Entered by patient    HPI Paula [name] is a 26 y.o. female.   Here today for evaluation of lingering cough that she has had for the last month.  She reports that cough is nonproductive but does not seem to be improving.  She reports that she has coughing fits that feels similar to prior asthma flares.  She has not had an inhaler.  She denies any fever.  She did have COVID last week but states the only lingering symptom is cough.  The history is provided by the patient.  Cough Associated symptoms: no chills, no ear pain, no eye discharge, no fever, no shortness of breath, no sore throat and no wheezing     Past Medical History:  Diagnosis Date   Asthma    Diabetes mellitus without complication [facility])     Patient Active Problem List   Diagnosis Date Noted   Morbid obesity (HCC) 12/17/2017   Menometrorrhagia 01/19/2014   General counseling for prescription of oral contraceptives 01/19/2014   MRSA (methicillin resistant staph aureus) culture positive 09/03/2011   Candida albicans infection 09/03/2011    Past Surgical History:  Procedure Laterality Date   GINGIVECTOMY  2010   TONSILECTOMY, ADENOIDECTOMY, BILATERAL MYRINGOTOMY AND TUBES      OB History     Gravida  0   Para  0   Term  0   Preterm  0   AB  0   Living  0      SAB  0   IAB  0   Ectopic  0   Multiple  0   Live Births               Home Medications    Prior to Admission medications   Medication Sig Start Date End Date Taking? Authorizing Provider  albuterol (VENTOLIN HFA) 108 (90 Base) MCG/ACT inhaler Inhale 1-2 puffs into the lungs every 6 (six) hours as needed for wheezing or shortness of breath. 01/08/22  Yes Tomi Bamberger, PA-C  predniSONE (DELTASONE) 20 MG tablet Take 2 tablets (40 mg total) by mouth daily with  breakfast for 5 days. 01/08/22 01/13/22 Yes Tomi Bamberger, PA-C  brompheniramine-pseudoephedrine-DM 30-2-10 MG/5ML syrup Take 5 mLs by mouth at bedtime as needed and may repeat dose one time if needed. 01/23/20   Bing Neighbors, FNP  cetirizine (ZYRTEC) 10 MG tablet Take 1 tablet (10 mg total) by mouth at bedtime. 10/17/19   Bing Neighbors, FNP  cyclobenzaprine (FLEXERIL) 10 MG tablet Take 1 tablet (10 mg total) by mouth 2 (two) times daily as needed for muscle spasms. 04/08/21   Tomi Bamberger, PA-C  fluticasone (FLONASE) 50 MCG/ACT nasal spray Place 2 sprays into both nostrils daily. Patient taking differently: Place 2 sprays into both nostrils daily as needed for allergies. 01/20/19   Hall-Potvin, [place], PA-C  ibuprofen (ADVIL,MOTRIN) 800 MG tablet Take 1 tablet (800 mg total) by mouth every 8 (eight) hours as needed. 01/23/18   Bethann Berkshire, MD  metroNIDAZOLE (FLAGYL) 500 MG tablet Take 1 tablet (500 mg total) by mouth 2 (two) times daily. 07/19/20   Lamptey, Britta Mccreedy, MD  metroNIDAZOLE (FLAGYL) 500 MG tablet Take 1 tablet (500 mg total) by mouth 2 (two) times daily. 05/09/21  Merrilee Jansky, MD  mupirocin ointment (BACTROBAN) 2 % Place 1 application into the nose 2 (two) times daily. 02/02/19   Mannie Stabile, PA-C  neomycin-polymyxin-hydrocortisone (CORTISPORIN) 3.5-10000-1 OTIC suspension Place 4 drops into the left ear 3 (three) times daily. 12/17/18   Hall-Potvin, [place], PA-C  norgestimate-ethinyl estradiol (ORTHO-CYCLEN,SPRINTEC,PREVIFEM) 0.25-35 MG-MCG tablet Take 1 tablet by mouth daily. 10/07/17   Allie Bossier, MD  ondansetron (ZOFRAN) 4 MG tablet Take 1 tablet (4 mg total) by mouth every 6 (six) hours. 01/23/20   Bing Neighbors, FNP    Family History Family History  Problem Relation Age of Onset   Hypertension Maternal Grandmother    Diabetes Father     Social History Social History   Tobacco Use   Smoking status: Never   Smokeless tobacco: Never  Vaping  Use   Vaping Use: Never used  Substance Use Topics   Alcohol use: Yes    Comment: occasionally   Drug use: No     Allergies   Patient has no known allergies.   Review of Systems Review of Systems  Constitutional:  Negative for chills and fever.  HENT:  Negative for congestion, ear pain and sore throat.   Eyes:  Negative for discharge and redness.  Respiratory:  Positive for cough. Negative for shortness of breath and wheezing.   Gastrointestinal:  Negative for abdominal pain, diarrhea, nausea and vomiting.     Physical Exam Triage Vital Signs ED Triage Vitals  Enc Vitals Group     BP 01/08/22 1557 138/75     Pulse Rate 01/08/22 1557 86     Resp 01/08/22 1557 18     Temp 01/08/22 1557 98.3 F (36.8 C)     Temp Source 01/08/22 1557 Oral     SpO2 01/08/22 1557 98 %     Weight --      Height --      Head Circumference --      Peak Flow --      Pain Score 01/08/22 1600 0     Pain Loc --      Pain Edu? --      Excl. in GC? --    No data found.  Updated Vital Signs BP 138/75 (BP Location: Left Arm)   Pulse 86   Temp 98.3 F (36.8 C) (Oral)   Resp 18   SpO2 98%      Physical Exam Vitals and nursing note reviewed.  Constitutional:      General: She is not in acute distress.    Appearance: Normal appearance. She is not ill-appearing.  HENT:     Head: Normocephalic and atraumatic.     Nose: Nose normal. No congestion or rhinorrhea.  Eyes:     Conjunctiva/sclera: Conjunctivae normal.  Cardiovascular:     Rate and Rhythm: Normal rate and regular rhythm.     Heart sounds: Normal heart sounds. No murmur heard. Pulmonary:     Effort: Pulmonary effort is normal. No respiratory distress.     Breath sounds: Normal breath sounds. No wheezing, rhonchi or rales.     Comments: Pulmonary exam limited due to constant cough with deep breathing Skin:    General: Skin is warm and dry.  Neurological:     Mental Status: She is alert.  Psychiatric:        Mood and Affect:  Mood normal.        Thought Content: Thought content normal.      UC Treatments /  Results  Labs (all labs ordered are listed, but only abnormal results are displayed) Labs Reviewed - No data to display  EKG   Radiology No results found.  Procedures Procedures (including critical care time)  Medications Ordered in UC Medications - No data to display  Initial Impression / Assessment and Plan / UC Course  I have reviewed the triage vital signs and the nursing notes.  Pertinent labs & imaging results that were available during my care of the patient were reviewed by me and considered in my medical decision making (see chart for details).    We will treat to cover asthma exacerbation with steroid burst and albuterol inhaler refilled.  Recommended follow-up if no gradual improvement with treatment or with any further concerns.  Patient expressed understanding.  Final Clinical Impressions(s) / UC Diagnoses   Final diagnoses:  Asthma with acute exacerbation, unspecified asthma severity, unspecified whether persistent   Discharge Instructions   None    ED Prescriptions     Medication Sig Dispense Auth. Provider   predniSONE (DELTASONE) 20 MG tablet Take 2 tablets (40 mg total) by mouth daily with breakfast for 5 days. 10 tablet Erma Pinto F, PA-C   albuterol (VENTOLIN HFA) 108 (90 Base) MCG/ACT inhaler Inhale 1-2 puffs into the lungs every 6 (six) hours as needed for wheezing or shortness of breath. 8 g Tomi Bamberger, PA-C      PDMP not reviewed this encounter.   Tomi Bamberger, PA-C 01/08/22 [phone]

## 2022-01-22 ENCOUNTER — Ambulatory Visit (HOSPITAL_COMMUNITY): Payer: Medicaid Other

## 2022-02-17 ENCOUNTER — Ambulatory Visit: Payer: Medicaid Other

## 2022-02-28 ENCOUNTER — Emergency Department (HOSPITAL_COMMUNITY): Payer: Medicaid Other

## 2022-02-28 ENCOUNTER — Emergency Department (HOSPITAL_COMMUNITY)
Admission: EM | Admit: 2022-02-28 | Discharge: 2022-02-28 | Discharge status: Against Medical Advice | Payer: Medicaid Other | Attending: Emergency Medicine | Admitting: Emergency Medicine

## 2022-02-28 ENCOUNTER — Other Ambulatory Visit: Payer: Self-pay

## 2022-02-28 ENCOUNTER — Encounter (HOSPITAL_COMMUNITY): Payer: Self-pay | Admitting: Emergency Medicine

## 2022-02-28 DIAGNOSIS — Z5321 Procedure and treatment not carried out due to patient leaving prior to being seen by health care provider: Secondary | ICD-10-CM | POA: Insufficient documentation

## 2022-02-28 DIAGNOSIS — R079 Chest pain, unspecified: Secondary | ICD-10-CM | POA: Insufficient documentation

## 2022-02-28 LAB — BASIC METABOLIC PANEL
Anion gap: 7 (ref 5–15)
BUN: 12 mg/dL (ref 6–20)
CO2: 24 mmol/L (ref 22–32)
Calcium: 9.6 mg/dL (ref 8.9–10.3)
Chloride: 106 mmol/L (ref 98–111)
Creatinine, Ser: 0.93 mg/dL (ref 0.44–1.00)
GFR, Estimated: >60 mL/min (ref >60)
Glucose, Bld: 111 mg/dL — ABNORMAL HIGH (ref 70–99)
Potassium: 3.8 mmol/L (ref 3.5–5.1)
Sodium: 137 mmol/L (ref 135–145)

## 2022-02-28 LAB — CBC
HCT: 38.4 % (ref 36.0–46.0)
Hemoglobin: 12.7 g/dL (ref 12.0–15.0)
MCH: 28.3 pg (ref 26.0–34.0)
MCHC: 33.1 g/dL (ref 30.0–36.0)
MCV: 85.5 fL (ref 80.0–100.0)
Platelets: 302 10*3/uL (ref 150–400)
RBC: 4.49 MIL/uL (ref 3.87–5.11)
RDW: 13.4 % (ref 11.5–15.5)
WBC: 6.3 10*3/uL (ref 4.0–10.5)
nRBC: 0 % (ref 0.0–0.2)

## 2022-02-28 LAB — I-STAT BETA HCG BLOOD, ED (MC, WL, AP ONLY): I-stat hCG, quantitative: <5 m[IU]/mL (ref <5)

## 2022-02-28 LAB — TROPONIN I (HIGH SENSITIVITY): Troponin I (High Sensitivity): 4 ng/L (ref <18)

## 2022-02-28 NOTE — ED Triage Notes (Signed)
Patient complains of chest pain that started a few days ago, pain is brought on by gasping and moving her arms. Patient is alert, oriented, and in no apparent distress at this time.

## 2022-02-28 NOTE — ED Provider Triage Note (Signed)
Emergency Medicine Provider Triage Evaluation Note  KAMIRA MELLETTE , a 26 y.o. female  was evaluated in triage.  Pt complains of CP for 2-3 days. States it was worse today. 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 Feels a bit clammy and sweaty she states. Nauseated. No abd pain. No fevers at home.    Review of Systems  Positive: CP Negative: Fever  Physical Exam  BP (!) 142/88 (BP Location: Right Arm)   Pulse 83   Temp 98 F (36.7 C) (Oral)   Resp 18   SpO2 100%  Gen:   Awake, no distress   Resp:  Normal effort  MSK:   Moves extremities without difficulty  Other:  TTP of chest  Medical Decision Making  Medically screening exam initiated at 2:44 PM.  Appropriate orders placed.  RISHIKA MCCOLLOM was informed that the remainder of the evaluation will be completed by another provider, this initial triage assessment does not replace that evaluation, and the importance of remaining in the ED until their evaluation is complete.  Labs, nausea and sweating are unusual for msk CP however reassuring history and exams.    Pati Gallo [place], [place] 02/28/22 1446

## 2022-03-03 ENCOUNTER — Ambulatory Visit: Payer: Medicaid Other

## 2022-05-06 ENCOUNTER — Ambulatory Visit: Payer: Medicaid Other

## 2022-05-14 ENCOUNTER — Ambulatory Visit: Payer: Medicaid Other

## 2022-05-27 ENCOUNTER — Ambulatory Visit: Payer: Medicaid Other

## 2022-06-03 ENCOUNTER — Ambulatory Visit: Payer: Medicaid Other

## 2022-06-30 ENCOUNTER — Ambulatory Visit: Payer: Medicaid Other

## 2022-07-28 ENCOUNTER — Ambulatory Visit
Admission: RE | Admit: 2022-07-28 | Discharge: 2022-07-28 | Disposition: A | Discharge status: Home / Self Care | Payer: Medicaid Other | Source: Ambulatory Visit | Attending: Physician Assistant | Admitting: Physician Assistant

## 2022-07-28 VITALS — BP 128/84 | HR 84 | Temp 98.0°F | Resp 18

## 2022-07-28 DIAGNOSIS — B9689 Other specified bacterial agents as the cause of diseases classified elsewhere: Secondary | ICD-10-CM | POA: Diagnosis not present

## 2022-07-28 DIAGNOSIS — N760 Acute vaginitis: Secondary | ICD-10-CM | POA: Diagnosis not present

## 2022-07-28 MED ORDER — METRONIDAZOLE 500 MG PO TABS: 500.0000 mg | ORAL_TABLET | Freq: Two times a day (BID) | ORAL | 0 refills | Status: DC

## 2022-07-28 NOTE — ED Provider Notes (Signed)
[name]    CSN: [id number] Arrival date & time: 07/28/22  1125      History   Chief Complaint Chief Complaint  Patient presents with   Vaginal Odor     HPI Paula [name] is a 27 y.o. female.   Complains of a vaginal odor.  Patient reports she has had bacterial vaginitis in the past.  Patient also reports having irregular periods she is currently on her period.  Does not have a gynecologist  The history is provided by the patient. No language interpreter was used.    Past Medical History:  Diagnosis Date   Asthma    Diabetes mellitus without complication [facility])     Patient Active Problem List   Diagnosis Date Noted   Morbid obesity ([place]) 12/17/2017   Menometrorrhagia 01/19/2014   General counseling for prescription of oral contraceptives 01/19/2014   MRSA (methicillin resistant staph aureus) culture positive 09/03/2011   Candida albicans infection 09/03/2011    Past Surgical History:  Procedure Laterality Date   GINGIVECTOMY  2010   TONSILECTOMY, ADENOIDECTOMY, BILATERAL MYRINGOTOMY AND TUBES      OB History     Gravida  0   Para  0   Term  0   Preterm  0   AB  0   Living  0      SAB  0   IAB  0   Ectopic  0   Multiple  0   Live Births               Home Medications    Prior to Admission medications   Medication Sig Start Date End Date Taking? Authorizing Provider  metroNIDAZOLE (FLAGYL) 500 MG tablet Take 1 tablet (500 mg total) by mouth 2 (two) times daily. 07/28/22  Yes Caryl Ada K, PA-C  albuterol (VENTOLIN HFA) 108 (90 Base) MCG/ACT inhaler Inhale 1-2 puffs into the lungs every 6 (six) hours as needed for wheezing or shortness of breath. 01/08/22   Francene Finders, PA-C  brompheniramine-pseudoephedrine-DM 30-2-10 MG/5ML syrup Take 5 mLs by mouth at bedtime as needed and may repeat dose one time if needed. 01/23/20   Scot Jun, NP  cetirizine (ZYRTEC) 10 MG tablet Take 1 tablet (10 mg total) by mouth at  bedtime. 10/17/19   Scot Jun, NP  cyclobenzaprine (FLEXERIL) 10 MG tablet Take 1 tablet (10 mg total) by mouth 2 (two) times daily as needed for muscle spasms. 04/08/21   Francene Finders, PA-C  fluticasone (FLONASE) 50 MCG/ACT nasal spray Place 2 sprays into both nostrils daily. Patient taking differently: Place 2 sprays into both nostrils daily as needed for allergies. 01/20/19   Hall-Potvin, [place], PA-C  ibuprofen (ADVIL,MOTRIN) 800 MG tablet Take 1 tablet (800 mg total) by mouth every 8 (eight) hours as needed. 01/23/18   Milton Ferguson, MD  mupirocin ointment (BACTROBAN) 2 % Place 1 application into the nose 2 (two) times daily. 02/02/19   Suzy Bouchard, PA-C  neomycin-polymyxin-hydrocortisone (CORTISPORIN) 3.5-10000-1 OTIC suspension Place 4 drops into the left ear 3 (three) times daily. 12/17/18   Hall-Potvin, [place], PA-C  norgestimate-ethinyl estradiol (ORTHO-CYCLEN,SPRINTEC,PREVIFEM) 0.25-35 MG-MCG tablet Take 1 tablet by mouth daily. 10/07/17   Emily Filbert, MD  ondansetron (ZOFRAN) 4 MG tablet Take 1 tablet (4 mg total) by mouth every 6 (six) hours. 01/23/20   Scot Jun, NP    Family History Family History  Problem Relation Age of Onset   Hypertension Maternal  Grandmother    Diabetes Father     Social History Social History   Tobacco Use   Smoking status: Never   Smokeless tobacco: Never  Vaping Use   Vaping Use: Never used  Substance Use Topics   Alcohol use: Yes    Comment: occasionally   Drug use: No     Allergies   Patient has no known allergies.   Review of Systems Review of Systems  All other systems reviewed and are negative.    Physical Exam Triage Vital Signs ED Triage Vitals  Enc Vitals Group     BP 07/28/22 1150 128/84     Pulse Rate 07/28/22 1149 84     Resp 07/28/22 1149 18     Temp 07/28/22 1149 98 F (36.7 C)     Temp Source 07/28/22 1149 Oral     SpO2 07/28/22 1149 97 %     Weight --      Height --      Head  Circumference --      Peak Flow --      Pain Score 07/28/22 1149 0     Pain Loc --      Pain Edu? --      Excl. in [place]? --    No data found.  Updated Vital Signs BP 128/84   Pulse 84   Temp 98 F (36.7 C) (Oral)   Resp 18   SpO2 97%   Visual Acuity Right Eye Distance:   Left Eye Distance:   Bilateral Distance:    Right Eye Near:   Left Eye Near:    Bilateral Near:     Physical Exam Vitals and nursing note reviewed.  Constitutional:      Appearance: She is well-developed.  HENT:     Head: Normocephalic.  Cardiovascular:     Rate and Rhythm: Normal rate.  Pulmonary:     Effort: Pulmonary effort is normal.  Abdominal:     General: There is no distension.  Musculoskeletal:        General: Normal range of motion.     Cervical back: Normal range of motion.  Skin:    General: Skin is warm.  Neurological:     General: No focal deficit present.     Mental Status: She is alert and oriented to person, place, and time.      UC Treatments / Results  Labs (all labs ordered are listed, but only abnormal results are displayed) Labs Reviewed - No data to display  EKG   Radiology No results found.  Procedures Procedures (including critical care time)  Medications Ordered in UC Medications - No data to display  Initial Impression / Assessment and Plan / UC Course  I have reviewed the triage vital signs and the nursing notes.  Pertinent labs & imaging results that were available during my care of the patient were reviewed by me and considered in my medical decision making (see chart for details).      Final Clinical Impressions(s) / UC Diagnoses   Final diagnoses:  Bacterial vaginitis   Discharge Instructions   None    ED Prescriptions     Medication Sig Dispense Auth. Provider   metroNIDAZOLE (FLAGYL) 500 MG tablet Take 1 tablet (500 mg total) by mouth 2 (two) times daily. 14 tablet Fransico Meadow, [place]      PDMP not reviewed this encounter. An  After Visit Summary was printed and given to the patient.  Fransico Meadow, [place] 07/28/22 1223

## 2022-07-28 NOTE — ED Triage Notes (Signed)
Pt presents with abnormal vaginal odor for unknown amount of time.

## 2022-07-29 LAB — CERVICOVAGINAL ANCILLARY ONLY
Bacterial Vaginitis (gardnerella): POSITIVE — AB
Chlamydia: NEGATIVE
Comment: NEGATIVE
Comment: NEGATIVE
Comment: NEGATIVE
Comment: NORMAL
Neisseria Gonorrhea: NEGATIVE
Trichomonas: NEGATIVE

## 2022-11-14 ENCOUNTER — Ambulatory Visit (HOSPITAL_COMMUNITY): Payer: Medicaid Other

## 2022-11-17 ENCOUNTER — Ambulatory Visit (HOSPITAL_COMMUNITY)
Admission: RE | Admit: 2022-11-17 | Discharge: 2022-11-17 | Disposition: A | Discharge status: Home / Self Care | Payer: Medicaid Other | Source: Ambulatory Visit | Attending: Nurse Practitioner | Admitting: Nurse Practitioner

## 2022-11-17 ENCOUNTER — Encounter (HOSPITAL_COMMUNITY): Payer: Self-pay

## 2022-11-17 VITALS — BP 119/80 | HR 90 | Temp 99.3°F | Resp 18

## 2022-11-17 DIAGNOSIS — N898 Other specified noninflammatory disorders of vagina: Secondary | ICD-10-CM | POA: Diagnosis present

## 2022-11-17 NOTE — ED Triage Notes (Signed)
Pt presents with complaints of possible yeast or bv infection. Pt has an odor x 1 week. Pt did change soaps.   Pt also has a break out on her forehead.

## 2022-11-17 NOTE — ED Provider Notes (Signed)
[facility]    CSN: [id number] Arrival date & time: 11/17/22  1534      History   Chief Complaint Chief Complaint  Patient presents with   Vaginal Discharge    Entered by patient    HPI Paula [name] is a 27 y.o. female.   Patient presents today with vaginal itching and vaginal odor for the past week.  She denies significant vaginal discharge, dysuria, urinary frequency or urgency, fever, nausea/vomiting.  No vaginal rashes, sores, or lesions.  Reports she is currently sexually active, no exposures to STI not concern for STI today.  Declines HIV and syphilis testing today.    LMP: 11/06/2022    Past Medical History:  Diagnosis Date   Asthma    Diabetes mellitus without complication [facility])     Patient Active Problem List   Diagnosis Date Noted   Morbid obesity (HCC) 12/17/2017   Menometrorrhagia 01/19/2014   General counseling for prescription of oral contraceptives 01/19/2014   MRSA (methicillin resistant staph aureus) culture positive 09/03/2011   Candida albicans infection 09/03/2011    Past Surgical History:  Procedure Laterality Date   GINGIVECTOMY  2010   TONSILECTOMY, ADENOIDECTOMY, BILATERAL MYRINGOTOMY AND TUBES      OB History     Gravida  0   Para  0   Term  0   Preterm  0   AB  0   Living  0      SAB  0   IAB  0   Ectopic  0   Multiple  0   Live Births               Home Medications    Prior to Admission medications   Medication Sig Start Date End Date Taking? Authorizing Provider  albuterol (VENTOLIN HFA) 108 (90 Base) MCG/ACT inhaler Inhale 1-2 puffs into the lungs every 6 (six) hours as needed for wheezing or shortness of breath. 01/08/22   Tomi Bamberger, PA-C  brompheniramine-pseudoephedrine-DM 30-2-10 MG/5ML syrup Take 5 mLs by mouth at bedtime as needed and may repeat dose one time if needed. 01/23/20   Bing Neighbors, NP  cetirizine (ZYRTEC) 10 MG tablet Take 1 tablet (10 mg total) by mouth at bedtime.  10/17/19   Bing Neighbors, NP  cyclobenzaprine (FLEXERIL) 10 MG tablet Take 1 tablet (10 mg total) by mouth 2 (two) times daily as needed for muscle spasms. 04/08/21   Tomi Bamberger, PA-C  fluticasone (FLONASE) 50 MCG/ACT nasal spray Place 2 sprays into both nostrils daily. Patient taking differently: Place 2 sprays into both nostrils daily as needed for allergies. 01/20/19   Hall-Potvin, [place], PA-C  ibuprofen (ADVIL,MOTRIN) 800 MG tablet Take 1 tablet (800 mg total) by mouth every 8 (eight) hours as needed. 01/23/18   Bethann Berkshire, MD  metroNIDAZOLE (FLAGYL) 500 MG tablet Take 1 tablet (500 mg total) by mouth 2 (two) times daily. 07/28/22   Elson Areas, PA-C  mupirocin ointment (BACTROBAN) 2 % Place 1 application into the nose 2 (two) times daily. 02/02/19   Mannie Stabile, PA-C  neomycin-polymyxin-hydrocortisone (CORTISPORIN) 3.5-10000-1 OTIC suspension Place 4 drops into the left ear 3 (three) times daily. 12/17/18   Hall-Potvin, [place], PA-C  norgestimate-ethinyl estradiol (ORTHO-CYCLEN,SPRINTEC,PREVIFEM) 0.25-35 MG-MCG tablet Take 1 tablet by mouth daily. 10/07/17   Allie Bossier, MD  ondansetron (ZOFRAN) 4 MG tablet Take 1 tablet (4 mg total) by mouth every 6 (six) hours. 01/23/20   Bing Neighbors,  NP    Family History Family History  Problem Relation Age of Onset   Hypertension Maternal Grandmother    Diabetes Father     Social History Social History   Tobacco Use   Smoking status: Never   Smokeless tobacco: Never  Vaping Use   Vaping status: Never Used  Substance Use Topics   Alcohol use: Yes    Comment: occasionally   Drug use: No     Allergies   Patient has no known allergies.   Review of Systems Review of Systems Per HPI  Physical Exam Triage Vital Signs ED Triage Vitals  Encounter Vitals Group     BP 11/17/22 1549 119/80     Systolic BP Percentile --      Diastolic BP Percentile --      Pulse Rate 11/17/22 1549 90     Resp 11/17/22 1549  18     Temp 11/17/22 1549 99.3 F (37.4 C)     Temp src --      SpO2 11/17/22 1549 95 %     Weight --      Height --      Head Circumference --      Peak Flow --      Pain Score 11/17/22 1546 0     Pain Loc --      Pain Education --      Exclude from Growth Chart --    No data found.  Updated Vital Signs BP 119/80   Pulse 90   Temp 99.3 F (37.4 C)   Resp 18   LMP 11/06/2022   SpO2 95%   Visual Acuity Right Eye Distance:   Left Eye Distance:   Bilateral Distance:    Right Eye Near:   Left Eye Near:    Bilateral Near:     Physical Exam Vitals and nursing note reviewed.  Constitutional:      General: She is not in acute distress.    Appearance: Normal appearance. She is not toxic-appearing.  Pulmonary:     Effort: Pulmonary effort is normal. No respiratory distress.  Genitourinary:    Comments: Deferred - self swab performed by patient Skin:    General: Skin is warm and dry.     Coloration: Skin is not jaundiced or pale.     Findings: No erythema.  Neurological:     Mental Status: She is alert and oriented to person, place, and time.     Motor: No weakness.     Gait: Gait normal.  Psychiatric:        Mood and Affect: Mood normal.        Behavior: Behavior is cooperative.      UC Treatments / Results  Labs (all labs ordered are listed, but only abnormal results are displayed) Labs Reviewed  CERVICOVAGINAL ANCILLARY ONLY    EKG   Radiology No results found.  Procedures Procedures (including critical care time)  Medications Ordered in UC Medications - No data to display  Initial Impression / Assessment and Plan / UC Course  I have reviewed the triage vital signs and the nursing notes.  Pertinent labs & imaging results that were available during my care of the patient were reviewed by me and considered in my medical decision making (see chart for details).   Patient is well-appearing, normotensive, afebrile, not tachycardic, not tachypneic,  oxygenating well on room air.    1. Vaginal itching Self swab cytology is pending Will defer treatment until results  have returned Treat as indicated Recommended condom use with every sexual encounter  The patient was given the opportunity to ask questions.  All questions answered to their satisfaction.  The patient is in agreement to this plan.    Final Clinical Impressions(s) / UC Diagnoses   Final diagnoses:  Vaginal itching     Discharge Instructions      We will contact you if the testing from today comes back positive for anything.  Recommend condom use with every sexual encounter to prevent STI.     ED Prescriptions   None    PDMP not reviewed this encounter.   Valentino Nose, NP 11/17/22 [phone]

## 2022-11-17 NOTE — Discharge Instructions (Signed)
We will contact you if the testing from today comes back positive for anything.  Recommend condom use with every sexual encounter to prevent STI.

## 2022-11-18 LAB — CERVICOVAGINAL ANCILLARY ONLY
Bacterial Vaginitis (gardnerella): POSITIVE — AB
Candida Glabrata: NEGATIVE
Candida Vaginitis: NEGATIVE
Chlamydia: NEGATIVE
Comment: NEGATIVE
Comment: NEGATIVE
Comment: NEGATIVE
Comment: NEGATIVE
Comment: NEGATIVE
Comment: NORMAL
Neisseria Gonorrhea: NEGATIVE
Trichomonas: NEGATIVE

## 2022-11-19 ENCOUNTER — Telehealth (HOSPITAL_COMMUNITY): Payer: Self-pay | Admitting: Emergency Medicine

## 2022-11-19 MED ORDER — METRONIDAZOLE 500 MG PO TABS: 500.0000 mg | ORAL_TABLET | Freq: Two times a day (BID) | ORAL | 0 refills | Status: DC

## 2022-11-24 ENCOUNTER — Encounter: Payer: Medicaid Other | Admitting: Obstetrics and Gynecology

## 2022-11-24 ENCOUNTER — Ambulatory Visit (HOSPITAL_COMMUNITY): Payer: Medicaid Other

## 2022-11-24 ENCOUNTER — Encounter (HOSPITAL_COMMUNITY): Payer: Self-pay

## 2022-11-24 ENCOUNTER — Ambulatory Visit (HOSPITAL_COMMUNITY)
Admission: RE | Admit: 2022-11-24 | Discharge: 2022-11-24 | Disposition: A | Discharge status: Home / Self Care | Payer: Medicaid Other | Source: Ambulatory Visit | Attending: Internal Medicine | Admitting: Internal Medicine

## 2022-11-24 VITALS — BP 122/87 | HR 86 | Temp 99.0°F | Resp 16

## 2022-11-24 DIAGNOSIS — R109 Unspecified abdominal pain: Secondary | ICD-10-CM | POA: Diagnosis not present

## 2022-11-24 DIAGNOSIS — N760 Acute vaginitis: Secondary | ICD-10-CM | POA: Insufficient documentation

## 2022-11-24 DIAGNOSIS — B9689 Other specified bacterial agents as the cause of diseases classified elsewhere: Secondary | ICD-10-CM | POA: Diagnosis not present

## 2022-11-24 LAB — POCT URINALYSIS DIP (MANUAL ENTRY)
Bilirubin, UA: NEGATIVE
Glucose, UA: NEGATIVE mg/dL
Ketones, POC UA: NEGATIVE mg/dL
Leukocytes, UA: NEGATIVE
Nitrite, UA: NEGATIVE
Spec Grav, UA: 1.025 (ref 1.010–1.025)
Urobilinogen, UA: 0.2 E.U./dL
pH, UA: 6 (ref 5.0–8.0)

## 2022-11-24 NOTE — ED Provider Notes (Signed)
[facility]    CSN: [id number] Arrival date & time: 11/24/22  1636     History   Chief Complaint Chief Complaint  Patient presents with   Urinary Frequency    Entered by patient   Dysuria   Headache    HPI Paula [name] is a 27 y.o. female.  Here with lower abdominal cramping for about a week, comes and goes. Rates 1/10 discomfort. No nausea, vomiting, diarrhea. Eating and drinking normally.   She was seen on 7/15 and dx with BV. Has taken 3 days of flagyl so far.   Denies dysuria, urgency, frequency, flank pain, fever No sick contacts known  LMP 7/4   Also headache that comes and goes for several months/years. Has been using tylenol and ibuprofen   Does not have PCP  Past Medical History:  Diagnosis Date   Asthma    Diabetes mellitus without complication (HCC)     Patient Active Problem List   Diagnosis Date Noted   Morbid obesity (HCC) 12/17/2017   Menometrorrhagia 01/19/2014   General counseling for prescription of oral contraceptives 01/19/2014   MRSA (methicillin resistant staph aureus) culture positive 09/03/2011   Candida albicans infection 09/03/2011    Past Surgical History:  Procedure Laterality Date   GINGIVECTOMY  2010   TONSILECTOMY, ADENOIDECTOMY, BILATERAL MYRINGOTOMY AND TUBES      OB History     Gravida  0   Para  0   Term  0   Preterm  0   AB  0   Living  0      SAB  0   IAB  0   Ectopic  0   Multiple  0   Live Births               Home Medications    Prior to Admission medications   Medication Sig Start Date End Date Taking? Authorizing Provider  albuterol (VENTOLIN HFA) 108 (90 Base) MCG/ACT inhaler Inhale 1-2 puffs into the lungs every 6 (six) hours as needed for wheezing or shortness of breath. 01/08/22   Tomi Bamberger, PA-C  brompheniramine-pseudoephedrine-DM 30-2-10 MG/5ML syrup Take 5 mLs by mouth at bedtime as needed and may repeat dose one time if needed. 01/23/20   Bing Neighbors, NP   cetirizine (ZYRTEC) 10 MG tablet Take 1 tablet (10 mg total) by mouth at bedtime. 10/17/19   Bing Neighbors, NP  cyclobenzaprine (FLEXERIL) 10 MG tablet Take 1 tablet (10 mg total) by mouth 2 (two) times daily as needed for muscle spasms. 04/08/21   Tomi Bamberger, PA-C  fluticasone (FLONASE) 50 MCG/ACT nasal spray Place 2 sprays into both nostrils daily. Patient taking differently: Place 2 sprays into both nostrils daily as needed for allergies. 01/20/19   Hall-Potvin, [place], PA-C  ibuprofen (ADVIL,MOTRIN) 800 MG tablet Take 1 tablet (800 mg total) by mouth every 8 (eight) hours as needed. 01/23/18   Bethann Berkshire, MD  metroNIDAZOLE (FLAGYL) 500 MG tablet Take 1 tablet (500 mg total) by mouth 2 (two) times daily. 11/19/22   Lamptey, Britta Mccreedy, MD  mupirocin ointment (BACTROBAN) 2 % Place 1 application into the nose 2 (two) times daily. 02/02/19   Mannie Stabile, PA-C  neomycin-polymyxin-hydrocortisone (CORTISPORIN) 3.5-10000-1 OTIC suspension Place 4 drops into the left ear 3 (three) times daily. 12/17/18   Hall-Potvin, [place], PA-C  norgestimate-ethinyl estradiol (ORTHO-CYCLEN,SPRINTEC,PREVIFEM) 0.25-35 MG-MCG tablet Take 1 tablet by mouth daily. 10/07/17   Allie Bossier, MD  ondansetron (  ZOFRAN) 4 MG tablet Take 1 tablet (4 mg total) by mouth every 6 (six) hours. 01/23/20   Bing Neighbors, NP    Family History Family History  Problem Relation Age of Onset   Hypertension Maternal Grandmother    Diabetes Father     Social History Social History   Tobacco Use   Smoking status: Never   Smokeless tobacco: Never  Vaping Use   Vaping status: Never Used  Substance Use Topics   Alcohol use: Yes    Comment: occasionally   Drug use: No     Allergies   Patient has no known allergies.   Review of Systems Review of Systems As per HPI  Physical Exam Triage Vital Signs ED Triage Vitals  Encounter Vitals Group     BP 11/24/22 1659 122/87     Systolic BP Percentile --       Diastolic BP Percentile --      Pulse Rate 11/24/22 1659 86     Resp 11/24/22 1659 16     Temp 11/24/22 1659 99 F (37.2 C)     Temp Source 11/24/22 1659 Oral     SpO2 11/24/22 1659 97 %     Weight --      Height --      Head Circumference --      Peak Flow --      Pain Score 11/24/22 1702 1     Pain Loc --      Pain Education --      Exclude from Growth Chart --    No data found.  Updated Vital Signs BP 122/87 (BP Location: Left Arm)   Pulse 86   Temp 99 F (37.2 C) (Oral)   Resp 16   LMP 11/06/2022   SpO2 97%   Physical Exam Vitals and nursing note reviewed.  Constitutional:      General: She is not in acute distress.    Appearance: Normal appearance.  HENT:     Mouth/Throat:     Pharynx: Oropharynx is clear.  Cardiovascular:     Rate and Rhythm: Normal rate and regular rhythm.     Pulses: Normal pulses.     Heart sounds: Normal heart sounds.  Pulmonary:     Effort: Pulmonary effort is normal.     Breath sounds: Normal breath sounds.  Abdominal:     General: There is no distension.     Palpations: Abdomen is soft.     Tenderness: There is no abdominal tenderness. There is no right CVA tenderness, left CVA tenderness, guarding or rebound.     Comments: Non tender to palpation   Neurological:     Mental Status: She is alert and oriented to person, place, and time.     UC Treatments / Results  Labs (all labs ordered are listed, but only abnormal results are displayed) Labs Reviewed  POCT URINALYSIS DIP (MANUAL ENTRY) - Abnormal; Notable for the following components:      Result Value   Blood, UA trace-intact (*)    Protein Ur, POC trace (*)    All other components within normal limits  URINE CULTURE    EKG  Radiology No results found.  Procedures Procedures   Medications Ordered in UC Medications - No data to display  Initial Impression / Assessment and Plan / UC Course  I have reviewed the triage vital signs and the nursing  notes.  Pertinent labs & imaging results that were available during my care  of the patient were reviewed by me and considered in my medical decision making (see chart for details).  UA trace RBC, otherwise unremarkable. Culture pending although low concern for UTI.  Suspect her lower abdominal cramping is related to her BV diagnosis.  Her symptoms started just after she left the urgent care on 7/15.  Has only taken 3 days of the Flagyl so far.  Discussed we will wait for culture to rule out urine infection, finish full course of Flagyl, increase fluids, monitor for worsening symptoms. Provided with 2 OB/GYN clinics for follow up. Set up with primary care provider for follow-up regarding her frequent headaches. ED precautions.   At end of visit patient is wondering about mental health services.  Provided with [facility] information.  Final Clinical Impressions(s) / UC Diagnoses   Final diagnoses:  Abdominal cramping  BV (bacterial vaginosis)     Discharge Instructions      We have set you up with primary care provider. Information is below.  Please call OB/GYN to make appointment for follow up  The behavioral health urgent care information is also below. You can view their website for everything they offer and further resources.  Please take all of the flagyl that was prescribed for your BV infection. Your abdominal cramping is likely related to this infection. Do not drink alcohol while using this medication! Your urine sample today was normal but I will send for culture to verify.   Please drink lots of water!    ED Prescriptions   None    PDMP not reviewed this encounter.   Kathrine Haddock 11/24/22 1924

## 2022-11-24 NOTE — ED Triage Notes (Addendum)
Patient c/o dysuria and urinary frequency since 7/15/224.   Pataient added that she has been having intermittent headaches x months.

## 2022-11-24 NOTE — Discharge Instructions (Addendum)
We have set you up with primary care provider. Information is below.  Please call OB/GYN to make appointment for follow up  The behavioral health urgent care information is also below. You can view their website for everything they offer and further resources.  Please take all of the flagyl that was prescribed for your BV infection. Your abdominal cramping is likely related to this infection. Do not drink alcohol while using this medication! Your urine sample today was normal but I will send for culture to verify.   Please drink lots of water!

## 2022-11-25 LAB — URINE CULTURE

## 2022-11-26 LAB — URINE CULTURE

## 2022-12-05 ENCOUNTER — Ambulatory Visit: Payer: Medicaid Other

## 2022-12-15 ENCOUNTER — Ambulatory Visit (HOSPITAL_COMMUNITY): Payer: Medicaid Other

## 2023-01-10 ENCOUNTER — Ambulatory Visit
Admission: RE | Admit: 2023-01-10 | Discharge: 2023-01-10 | Disposition: A | Discharge status: Home / Self Care | Payer: Medicaid Other | Source: Ambulatory Visit | Attending: Internal Medicine | Admitting: Internal Medicine

## 2023-01-10 VITALS — BP 138/89 | HR 82 | Temp 98.5°F | Resp 18 | Ht 64.0 in | Wt 237.0 lb

## 2023-01-10 DIAGNOSIS — Z20822 Contact with and (suspected) exposure to covid-19: Secondary | ICD-10-CM | POA: Insufficient documentation

## 2023-01-10 DIAGNOSIS — J029 Acute pharyngitis, unspecified: Secondary | ICD-10-CM | POA: Insufficient documentation

## 2023-01-10 DIAGNOSIS — R350 Frequency of micturition: Secondary | ICD-10-CM | POA: Insufficient documentation

## 2023-01-10 DIAGNOSIS — R110 Nausea: Secondary | ICD-10-CM | POA: Diagnosis not present

## 2023-01-10 DIAGNOSIS — B349 Viral infection, unspecified: Secondary | ICD-10-CM | POA: Diagnosis not present

## 2023-01-10 LAB — POCT URINALYSIS DIP (MANUAL ENTRY)
Bilirubin, UA: NEGATIVE
Glucose, UA: NEGATIVE mg/dL
Ketones, POC UA: NEGATIVE mg/dL
Nitrite, UA: NEGATIVE
Protein Ur, POC: NEGATIVE mg/dL
Spec Grav, UA: 1.02 (ref 1.010–1.025)
Urobilinogen, UA: 0.2 U/dL
pH, UA: 7 (ref 5.0–8.0)

## 2023-01-10 LAB — POCT RAPID STREP A (OFFICE): Rapid Strep A Screen: NEGATIVE

## 2023-01-10 LAB — POCT URINE PREGNANCY: Preg Test, Ur: NEGATIVE

## 2023-01-10 MED ORDER — ONDANSETRON 4 MG PO TBDP: 4.0000 mg | ORAL_TABLET | Freq: Three times a day (TID) | ORAL | 0 refills | Status: DC | PRN

## 2023-01-10 NOTE — Discharge Instructions (Signed)
Suspect that you have a viral illness as we discussed.  Nausea medication has been prescribed.  Ensure adequate fluid hydration and rest. covid test is pending.  Recommend that you follow-up with urogynecology given persistent urinary frequency.

## 2023-01-10 NOTE — ED Provider Notes (Signed)
[name]    CSN: [id number] Arrival date & time: 01/10/23  1354      History   Chief Complaint Chief Complaint  Patient presents with   Nausea    Entered by patient    HPI Paula [name] is a 27 y.o. female.   Patient presents with chills, nausea without vomiting, headache that started about 4 days ago.  Denies diarrhea.  Denies nasal congestion, runny nose, cough, fever but reports that she had a sore throat at start of symptoms which is now resolved.  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  She has been able to tolerate food and fluids.  Has been urinating appropriately but states that she has had urinary frequency for quite some time and has been evaluated for this and was told that she did not have a UTI. This symptom has not changed since symptoms started and she denies dysuria or vaginal discharge.  Last menstrual cycle was at the end of August but patient is not sure of exact date.      Past Medical History:  Diagnosis Date   Asthma    Diabetes mellitus without complication [facility])     Patient Active Problem List   Diagnosis Date Noted   Morbid obesity (HCC) 12/17/2017   Menometrorrhagia 01/19/2014   General counseling for prescription of oral contraceptives 01/19/2014   MRSA (methicillin resistant staph aureus) culture positive 09/03/2011   Candida albicans infection 09/03/2011    Past Surgical History:  Procedure Laterality Date   GINGIVECTOMY  2010   TONSILECTOMY, ADENOIDECTOMY, BILATERAL MYRINGOTOMY AND TUBES      OB History     Gravida  0   Para  0   Term  0   Preterm  0   AB  0   Living  0      SAB  0   IAB  0   Ectopic  0   Multiple  0   Live Births               Home Medications    Prior to Admission medications   Medication Sig Start Date End Date Taking? Authorizing Provider  albuterol (VENTOLIN HFA) 108 (90 Base) MCG/ACT inhaler  Inhale 1-2 puffs into the lungs every 6 (six) hours as needed for wheezing or shortness of breath. 01/08/22  Yes Tomi Bamberger, PA-C  cetirizine (ZYRTEC) 10 MG tablet Take 1 tablet (10 mg total) by mouth at bedtime. 10/17/19  Yes Bing Neighbors, NP  cyclobenzaprine (FLEXERIL) 10 MG tablet Take 1 tablet (10 mg total) by mouth 2 (two) times daily as needed for muscle spasms. 04/08/21  Yes Tomi Bamberger, PA-C  fluticasone [facility]) 50 MCG/ACT nasal spray Place 2 sprays into both nostrils daily. Patient taking differently: Place 2 sprays into both nostrils daily as needed for allergies. 01/20/19  Yes Hall-Potvin, [place], PA-C  ibuprofen (ADVIL,MOTRIN) 800 MG tablet Take 1 tablet (800 mg total) by mouth every 8 (eight) hours as needed. 01/23/18  Yes Bethann Berkshire, MD  mupirocin ointment (BACTROBAN) 2 % Place 1 application into the nose 2 (two) times daily. 02/02/19  Yes Aberman, Merla Riches, PA-C  neomycin-polymyxin-hydrocortisone (CORTISPORIN) 3.5-10000-1 OTIC suspension Place 4 drops into the left ear 3 (three) times daily. 12/17/18  Yes Hall-Potvin, [place], PA-C  norgestimate-ethinyl estradiol (ORTHO-CYCLEN,SPRINTEC,PREVIFEM) 0.25-35 MG-MCG tablet Take 1 tablet by mouth daily. 10/07/17  Yes Allie Bossier, MD  ondansetron (ZOFRAN-ODT) 4 MG disintegrating tablet Take 1 tablet (4 mg total) by mouth every 8 (eight) hours as needed for nausea or vomiting. 01/10/23  Yes Malvina Schadler, Acie Fredrickson, FNP  brompheniramine-pseudoephedrine-DM 30-2-10 MG/5ML syrup Take 5 mLs by mouth at bedtime as needed and may repeat dose one time if needed. 01/23/20   Bing Neighbors, NP  metroNIDAZOLE (FLAGYL) 500 MG tablet Take 1 tablet (500 mg total) by mouth 2 (two) times daily. 11/19/22   Lamptey, Britta Mccreedy, MD    Family History Family History  Problem Relation Age of Onset   Hypertension Maternal Grandmother    Diabetes Father     Social History Social History   Tobacco Use   Smoking status: Never   Smokeless tobacco: Never   Vaping Use   Vaping status: Never Used  Substance Use Topics   Alcohol use: Yes    Comment: occasionally   Drug use: No     Allergies   Patient has no known allergies.   Review of Systems Review of Systems Per HPI  Physical Exam Triage Vital Signs ED Triage Vitals  Encounter Vitals Group     BP 01/10/23 1447 138/89     Systolic BP Percentile --      Diastolic BP Percentile --      Pulse Rate 01/10/23 1447 82     Resp 01/10/23 1447 18     Temp 01/10/23 1447 98.5 F (36.9 C)     Temp Source 01/10/23 1447 Oral     SpO2 01/10/23 1447 97 %     Weight 01/10/23 1446 237 lb (107.5 kg)     Height 01/10/23 1446 5\' 4"  (1.626 m)     Head Circumference --      Peak Flow --      Pain Score 01/10/23 1445 8     Pain Loc --      Pain Education --      Exclude from Growth Chart --    No data found.  Updated Vital Signs BP 138/89 (BP Location: Left Arm)   Pulse 82   Temp 98.5 F (36.9 C) (Oral)   Resp 18   Ht 5\' 4"  (1.626 m)   Wt 237 lb (107.5 kg)   LMP 12/31/2022   SpO2 97%   BMI 40.68 kg/m   Visual Acuity Right Eye Distance:   Left Eye Distance:   Bilateral Distance:    Right Eye Near:   Left Eye Near:    Bilateral Near:     Physical Exam Constitutional:      General: She is not in acute distress.    Appearance: Normal appearance. She is not toxic-appearing or diaphoretic.  HENT:     Head: Normocephalic and atraumatic.     Right Ear: Tympanic membrane and ear canal normal.     Left Ear: Tympanic membrane and ear canal normal.     Nose: No congestion.     Mouth/Throat:     Mouth: Mucous membranes are moist.     Pharynx: Posterior oropharyngeal erythema present.  Eyes:     Extraocular Movements: Extraocular movements intact.     Conjunctiva/sclera: Conjunctivae normal.     Pupils: Pupils are equal, round, and reactive to light.  Cardiovascular:     Rate and Rhythm: Normal rate and regular rhythm.     Pulses: Normal pulses.     Heart sounds: Normal  heart sounds.  Pulmonary:     Effort: Pulmonary effort is normal. No respiratory distress.  Breath sounds: Normal breath sounds. No stridor. No wheezing, rhonchi or rales.  Abdominal:     General: Abdomen is flat. Bowel sounds are normal. There is no distension.     Palpations: Abdomen is soft.     Tenderness: There is no abdominal tenderness.  Musculoskeletal:        General: Normal range of motion.     Cervical back: Normal range of motion.  Skin:    General: Skin is warm and dry.  Neurological:     General: No focal deficit present.     Mental Status: She is alert and oriented to person, place, and time. Mental status is at baseline.  Psychiatric:        Mood and Affect: Mood normal.        Behavior: Behavior normal.      UC Treatments / Results  Labs (all labs ordered are listed, but only abnormal results are displayed) Labs Reviewed  POCT URINALYSIS DIP (MANUAL ENTRY) - Abnormal; Notable for the following components:      Result Value   Blood, UA trace-intact (*)    Leukocytes, UA Trace (*)    All other components within normal limits  URINE CULTURE  CULTURE, GROUP A STREP ([facility])  SARS CORONAVIRUS 2 (TAT 6-24 HRS)  POCT URINE PREGNANCY  POCT RAPID STREP A (OFFICE)    EKG   Radiology No results found.  Procedures Procedures (including critical care time)  Medications Ordered in UC Medications - No data to display  Initial Impression / Assessment and Plan / UC Course  I have reviewed the triage vital signs and the nursing notes.  Pertinent labs & imaging results that were available during my care of the patient were reviewed by me and considered in my medical decision making (see chart for details).     Suspect viral cause to nausea, chills, headache.  Rapid strep was completed given patient reported previous sore throat.  It was negative.  Throat culture pending.  Patient reporting exposure to COVID-19 at work so this test is also pending.  There are  no signs of acute abdomen or dehydration on exam that would warrant emergent evaluation or imaging of the abdomen.  UA completed given urinary frequency which only shows trace leukocytes but will send urine culture to confirm.  Patient reports urinary frequency has been present for quite some time so recommended that she see urogynecologist at provided contact information for this.  Do not think urinary frequency is related to current symptoms.  Urine pregnancy test negative.  Advised supportive care and symptom management.  Ondansetron prescribed to take as needed for nausea as patient denies that she has any current prescriptions for this and denies that she takes any daily medications.  Advised strict follow-up precautions.  Patient verbalized understanding and was agreeable with plan. Final Clinical Impressions(s) / UC Diagnoses   Final diagnoses:  Viral illness  Nausea without vomiting  Sore throat  Urinary frequency  Encounter for laboratory testing for COVID-19 virus     Discharge Instructions      Suspect that you have a viral illness as we discussed.  Nausea medication has been prescribed.  Ensure adequate fluid hydration and rest. covid test is pending.  Recommend that you follow-up with urogynecology given persistent urinary frequency.     ED Prescriptions     Medication Sig Dispense Auth. Provider   ondansetron (ZOFRAN-ODT) 4 MG disintegrating tablet Take 1 tablet (4 mg total) by mouth every 8 (eight) hours  as needed for nausea or vomiting. 20 tablet [place], Acie Fredrickson, [place]      PDMP not reviewed this encounter.   Gustavus Bryant, [place] 01/10/23 [phone]

## 2023-01-10 NOTE — ED Triage Notes (Signed)
Pt states that she has chills, nausea, and headache x3 days

## 2023-01-11 LAB — SARS CORONAVIRUS 2 (TAT 6-24 HRS): SARS Coronavirus 2: NEGATIVE

## 2023-01-13 ENCOUNTER — Telehealth: Payer: Self-pay

## 2023-01-13 LAB — CULTURE, GROUP A STREP (THRC)

## 2023-01-13 LAB — URINE CULTURE: Culture: 30000 — AB

## 2023-01-13 MED ORDER — CEPHALEXIN 500 MG PO CAPS: 500.0000 mg | ORAL_CAPSULE | Freq: Two times a day (BID) | ORAL | 0 refills | Status: AC

## 2023-02-09 ENCOUNTER — Ambulatory Visit (INDEPENDENT_AMBULATORY_CARE_PROVIDER_SITE_OTHER): Payer: Medicaid Other | Admitting: Family

## 2023-02-09 ENCOUNTER — Encounter: Payer: Self-pay | Admitting: Family

## 2023-02-09 VITALS — BP 139/88 | HR 70 | Temp 98.0°F | Resp 16 | Ht 65.0 in | Wt 231.2 lb

## 2023-02-09 DIAGNOSIS — Z87898 Personal history of other specified conditions: Secondary | ICD-10-CM | POA: Diagnosis not present

## 2023-02-09 DIAGNOSIS — Z7689 Persons encountering health services in other specified circumstances: Secondary | ICD-10-CM

## 2023-02-09 DIAGNOSIS — F419 Anxiety disorder, unspecified: Secondary | ICD-10-CM

## 2023-02-09 DIAGNOSIS — F32A Depression, unspecified: Secondary | ICD-10-CM

## 2023-02-09 DIAGNOSIS — N6489 Other specified disorders of breast: Secondary | ICD-10-CM

## 2023-02-09 DIAGNOSIS — Z131 Encounter for screening for diabetes mellitus: Secondary | ICD-10-CM

## 2023-02-09 DIAGNOSIS — Z13228 Encounter for screening for other metabolic disorders: Secondary | ICD-10-CM

## 2023-02-09 MED ORDER — HYDROXYZINE PAMOATE 25 MG PO CAPS: 25.0000 mg | ORAL_CAPSULE | Freq: Three times a day (TID) | ORAL | 1 refills | Status: DC | PRN

## 2023-02-09 MED ORDER — ESCITALOPRAM OXALATE 10 MG PO TABS: 10.0000 mg | ORAL_TABLET | Freq: Every day | ORAL | 1 refills | Status: DC

## 2023-02-09 NOTE — Progress Notes (Signed)
Patient is here to established care with provider. ~health hx address ~care gaps address  

## 2023-02-09 NOTE — Progress Notes (Signed)
Subjective:    Paula [name] - 27 y.o. female MRN [id number]  Date of birth: 1995/07/02  HPI  Paula [name] is to establish care.   Current issues and/or concerns: - Anxiety depression related to work-life balance. Reports her mother passed away in March 20, 2021. Reports she is helping to care for her 23 y.o. sister. They are living with her grandmother. Reports her father lives in [place]. States she takes her sister's Adderall to help with symptoms. She denies thoughts of self-harm, suicidal ideations, homicidal ideations. - Requests referral for bilateral breast reduction. Also, states she is trying to lose weight.  - History of prediabetes. - Established with Dermatology for psoriasis management.  - No further issues/concerns for discussion today.    ROS per HPI     Health Maintenance:  There are no preventive care reminders to display for this patient.   Past Medical History: Patient Active Problem List   Diagnosis Date Noted   Macromastia 08/31/2020   Morbid obesity (HCC) 12/17/2017   Menometrorrhagia 01/19/2014   General counseling for prescription of oral contraceptives 01/19/2014   MRSA (methicillin resistant staph aureus) culture positive 09/03/2011   Candida albicans infection 09/03/2011      Social History   reports that she has never smoked. She has never used smokeless tobacco. She reports current alcohol use. She reports that she does not use drugs.   Family History  family history includes Diabetes in her father; Hypertension in her maternal grandmother.   Medications: reviewed and updated   Objective:   Physical Exam BP 139/88   Pulse 70   Temp 98 F (36.7 C) (Oral)   Resp 16   Ht 5\' 5"  (1.651 m)   Wt 231 lb 3.2 oz (104.9 kg)   LMP 12/31/2022   SpO2 97%   BMI 38.47 kg/m   Physical Exam HENT:     Head: Normocephalic and atraumatic.     Nose: Nose normal.     Mouth/Throat:     Mouth: Mucous membranes are moist.     Pharynx: Oropharynx is clear.   Eyes:     Extraocular Movements: Extraocular movements intact.     Conjunctiva/sclera: Conjunctivae normal.     Pupils: Pupils are equal, round, and reactive to light.  Cardiovascular:     Rate and Rhythm: Normal rate and regular rhythm.     Pulses: Normal pulses.     Heart sounds: Normal heart sounds.  Pulmonary:     Effort: Pulmonary effort is normal.     Breath sounds: Normal breath sounds.  Musculoskeletal:        General: Normal range of motion.     Cervical back: Normal range of motion and neck supple.  Neurological:     General: No focal deficit present.     Mental Status: She is alert and oriented to person, place, and time.  Psychiatric:        Mood and Affect: Affect is tearful.    Assessment & Plan:  1. Encounter to establish care - Patient presents today to establish care. During the interim follow-up with primary provider as scheduled.  - Return for annual physical examination, labs, and health maintenance. Arrive fasting meaning having no food for at least 8 hours prior to appointment. You may have only water or black coffee. Please take scheduled medications as normal.  2. Anxiety and depression - Patient denies thoughts of self-harm, suicidal ideations, homicidal ideations. - Escitalopram and Hydroxyzine as prescribed. Counseled on medication adherence/adverse  effects.  - Referral to Psychiatry for evaluation/management. During the interim follow-up with primary provider as scheduled until established with referral. - Ambulatory referral to Psychiatry - escitalopram (LEXAPRO) 10 MG tablet; Take 1 tablet (10 mg total) by mouth at bedtime.  Dispense: 30 tablet; Refill: 1 - hydrOXYzine (VISTARIL) 25 MG capsule; Take 1 capsule (25 mg total) by mouth every 8 (eight) hours as needed.  Dispense: 30 capsule; Refill: 1  3. Bilateral pendulous breasts - Referral to Plastic Surgery for evaluation/management.  - Ambulatory referral to Plastic Surgery  4. Screening for  metabolic disorder - Routine screening.  - CMP14+EGFR  5. Diabetes mellitus screening 6. History of prediabetes - Routine screening.  - Hemoglobin A1c   Patient was given clear instructions to go to Emergency Department or return to medical center if symptoms don't improve, worsen, or new problems develop.The patient verbalized understanding.  I discussed the assessment and treatment plan with the patient. The patient was provided an opportunity to ask questions and all were answered. The patient agreed with the plan and demonstrated an understanding of the instructions.   The patient was advised to call back or seek an in-person evaluation if the symptoms worsen or if the condition fails to improve as anticipated.    Ricky Stabs, NP 02/09/2023, 9:40 AM Primary Care at [facility]

## 2023-02-10 LAB — CMP14+EGFR
ALT: 14 [IU]/L (ref 0–32)
AST: 20 [IU]/L (ref 0–40)
Albumin: 4.4 g/dL (ref 4.0–5.0)
Alkaline Phosphatase: 63 [IU]/L (ref 44–121)
BUN/Creatinine Ratio: 9 (ref 9–23)
BUN: 9 mg/dL (ref 6–20)
Bilirubin Total: 0.5 mg/dL (ref 0.0–1.2)
CO2: 19 mmol/L — ABNORMAL LOW (ref 20–29)
Calcium: 9.6 mg/dL (ref 8.7–10.2)
Chloride: 104 mmol/L (ref 96–106)
Creatinine, Ser: 0.97 mg/dL (ref 0.57–1.00)
Globulin, Total: 2.8 g/dL (ref 1.5–4.5)
Glucose: 102 mg/dL — ABNORMAL HIGH (ref 70–99)
Potassium: 4.3 mmol/L (ref 3.5–5.2)
Sodium: 138 mmol/L (ref 134–144)
Total Protein: 7.2 g/dL (ref 6.0–8.5)
eGFR: 83 mL/min/{1.73_m2} (ref >59)

## 2023-02-10 LAB — HEMOGLOBIN A1C
Est. average glucose Bld gHb Est-mCnc: 114 mg/dL
Hgb A1c MFr Bld: 5.6 % (ref 4.8–5.6)

## 2023-03-20 ENCOUNTER — Ambulatory Visit (HOSPITAL_COMMUNITY): Payer: Medicaid Other

## 2023-03-27 ENCOUNTER — Ambulatory Visit (INDEPENDENT_AMBULATORY_CARE_PROVIDER_SITE_OTHER): Payer: Medicaid Other | Admitting: Plastic Surgery

## 2023-03-27 ENCOUNTER — Telehealth: Payer: Self-pay | Admitting: Plastic Surgery

## 2023-03-27 VITALS — BP 129/87 | HR 79 | Ht 64.0 in | Wt 235.0 lb

## 2023-03-27 DIAGNOSIS — M545 Low back pain, unspecified: Secondary | ICD-10-CM

## 2023-03-27 DIAGNOSIS — M542 Cervicalgia: Secondary | ICD-10-CM | POA: Diagnosis not present

## 2023-03-27 DIAGNOSIS — G8929 Other chronic pain: Secondary | ICD-10-CM

## 2023-03-27 DIAGNOSIS — N62 Hypertrophy of breast: Secondary | ICD-10-CM | POA: Diagnosis not present

## 2023-03-27 DIAGNOSIS — M546 Pain in thoracic spine: Secondary | ICD-10-CM | POA: Diagnosis not present

## 2023-03-27 DIAGNOSIS — Z6841 Body Mass Index (BMI) 40.0 and over, adult: Secondary | ICD-10-CM

## 2023-03-27 DIAGNOSIS — M549 Dorsalgia, unspecified: Secondary | ICD-10-CM | POA: Insufficient documentation

## 2023-03-27 DIAGNOSIS — R21 Rash and other nonspecific skin eruption: Secondary | ICD-10-CM

## 2023-03-27 NOTE — Telephone Encounter (Signed)
Patient called asking if she could still be seen by Dr D. Per Dr D okay to check in patient

## 2023-03-27 NOTE — Telephone Encounter (Signed)
Pt was 30 mins late, provider still agreed to see the consult, pt was made aware of a late fee $50.00

## 2023-03-27 NOTE — Progress Notes (Signed)
Patient ID: Paula [name], female    DOB: 03-06-1996, 27 y.o.   MRN: [id number]   Chief Complaint  Patient presents with   Breast Problem    Mammary Hyperplasia: The patient is a 27 y.o. female with a history of mammary hyperplasia for several years.  She has extremely large breasts causing symptoms that include the following: Back pain in the upper and lower back, including neck pain. She pulls or pins her bra straps to provide better lift and relief of the pressure and pain. She notices relief by holding her breast up manually.  Her shoulder straps cause grooves and pain and pressure that requires padding for relief. Pain medication is sometimes required with motrin and tylenol.  Activities that are hindered by enlarged breasts include: exercise and running.  She has tried supportive clothing as well as fitted bras without improvement.  Her breasts are extremely large and fairly symmetric.  She has hyperpigmentation of the inframammary area on both sides.  The sternal to nipple distance on the right is 36 cm and the left is 38 cm.  The IMF distance is 20 cm.  She is 5 feet 4 inches tall and weighs 235 pounds.  The BMI = 40.2 kg/m.  Preoperative bra size = G cup.  The estimated excess breast tissue to be removed at the time of surgery = 725 grams on the left and 725 grams on the right.  Mammogram history: none.  Family history of breast cancer:  none.  Tobacco use:  none.   The patient expresses the desire to pursue surgical intervention.  The patient's past surgical history is positive for tonsillectomy and wisdom teeth removal     Review of Systems  Constitutional:  Positive for activity change. Negative for appetite change.  HENT: Negative.    Eyes: Negative.   Respiratory: Negative.    Cardiovascular: Negative.  Negative for leg swelling.  Gastrointestinal: Negative.   Endocrine: Negative.   Genitourinary: Negative.   Musculoskeletal:  Positive for back pain and neck pain.  Skin:   Positive for rash.    Past Medical History:  Diagnosis Date   Asthma    Diabetes mellitus without complication (HCC)     Past Surgical History:  Procedure Laterality Date   GINGIVECTOMY  2010   TONSILECTOMY, ADENOIDECTOMY, BILATERAL MYRINGOTOMY AND TUBES        Current Outpatient Medications:    albuterol (VENTOLIN HFA) 108 (90 Base) MCG/ACT inhaler, Inhale 1-2 puffs into the lungs every 6 (six) hours as needed for wheezing or shortness of breath., Disp: 8 g, Rfl: 0   escitalopram (LEXAPRO) 10 MG tablet, Take 1 tablet (10 mg total) by mouth at bedtime., Disp: 30 tablet, Rfl: 1   fluconazole (DIFLUCAN) 200 MG tablet, Take 1 tablet by mouth 2 (two) times daily., Disp: , Rfl:    Fluocinolone Acetonide Scalp 0.01 % OIL, Apply on scalp 2-3 times per week on scalp. Do not need to rinse, Disp: , Rfl:    fluticasone (FLONASE) 50 MCG/ACT nasal spray, Place 2 sprays into both nostrils daily. (Patient taking differently: Place 2 sprays into both nostrils daily as needed for allergies.), Disp: 16 g, Rfl: 0   griseofulvin (GRIS-PEG) 250 MG tablet, Take 2 tablets by mouth daily., Disp: , Rfl:    hydrocortisone 2.5 % cream, USE TWICE DAILY ON FACE LESIONS, Disp: , Rfl:    hydrOXYzine (VISTARIL) 25 MG capsule, Take 1 capsule (25 mg total) by mouth every 8 (eight)  hours as needed., Disp: 30 capsule, Rfl: 1   ibuprofen (ADVIL,MOTRIN) 800 MG tablet, Take 1 tablet (800 mg total) by mouth every 8 (eight) hours as needed., Disp: 20 tablet, Rfl: 0   ketoconazole (NIZORAL) 2 % shampoo, Lather onto the scalp and allow to sit for 5 to 10 minutes, then rinse off. Do this 2 to 3 times per week., Disp: , Rfl:    metroNIDAZOLE (FLAGYL) 500 MG tablet, Take 1 tablet (500 mg total) by mouth 2 (two) times daily., Disp: 14 tablet, Rfl: 0   mupirocin ointment (BACTROBAN) 2 %, Place 1 application into the nose 2 (two) times daily., Disp: 22 g, Rfl: 0   neomycin-polymyxin-hydrocortisone (CORTISPORIN) 3.5-10000-1 OTIC  suspension, Place 4 drops into the left ear 3 (three) times daily., Disp: 10 mL, Rfl: 0   norgestimate-ethinyl estradiol (ORTHO-CYCLEN,SPRINTEC,PREVIFEM) 0.25-35 MG-MCG tablet, Take 1 tablet by mouth daily., Disp: 1 Package, Rfl: 11   nystatin ointment (MYCOSTATIN), APPLY TO THE AFFECTED AREA(S) BY TOPICAL ROUTE 2 TIMES PER DAY FOR 30 DAYS, Disp: , Rfl:    ondansetron (ZOFRAN-ODT) 4 MG disintegrating tablet, Take 1 tablet (4 mg total) by mouth every 8 (eight) hours as needed for nausea or vomiting., Disp: 20 tablet, Rfl: 0   pimecrolimus (ELIDEL) 1 % cream, USE TWICE DAILY ON FACE LESIONS, Disp: , Rfl:    triamcinolone (KENALOG) 0.025 % ointment, Apply topically 2 (two) times daily., Disp: , Rfl:    Objective:   Vitals:   03/27/23 0925  BP: 129/87  Pulse: 79    Physical Exam Vitals and nursing note reviewed.  Constitutional:      Appearance: Normal appearance.  HENT:     Head: Normocephalic and atraumatic.  Cardiovascular:     Rate and Rhythm: Normal rate.     Pulses: Normal pulses.  Pulmonary:     Effort: Pulmonary effort is normal.  Abdominal:     Palpations: Abdomen is soft.  Musculoskeletal:        General: No swelling or deformity.  Skin:    General: Skin is warm.     Coloration: Skin is not jaundiced.     Findings: No bruising.  Neurological:     Mental Status: She is alert and oriented to person, place, and time.  Psychiatric:        Mood and Affect: Mood normal.        Behavior: Behavior normal.        Thought Content: Thought content normal.        Judgment: Judgment normal.     Assessment & Plan:  Macromastia  Chronic bilateral thoracic back pain  Neck pain  The procedure the patient selected and that was best for the patient was discussed. The risk were discussed and include but not limited to the following:  Breast asymmetry, fluid accumulation, firmness of the breast, inability to breast feed, loss of nipple or areola, skin loss, change in skin and  nipple sensation, fat necrosis of the breast tissue, bleeding, infection and healing delay.  There are risks of anesthesia and injury to nerves or blood vessels.  Allergic reaction to tape, suture and skin glue are possible.  There will be swelling.  Any of these can lead to the need for revisional surgery which is not included in this surgery.  A breast reduction has potential to interfere with diagnostic procedures in the future.  This procedure is best done when the breast is fully developed.  Changes in the breast will continue to  occur over time: pregnancy, weight gain or weigh loss. No guarantees are given for a certain bra or breast size.    Total time: 40 minutes. This includes time spent with the patient during the visit as well as time spent before and after the visit reviewing the chart, documenting the encounter, ordering pertinent studies and literature for the patient.     The patient is a good candidate for bilateral breast reduction with liposuction.  Pictures were obtained of the patient and placed in the chart with the patient's or guardian's permission.   Alena Bills Ryelle Ruvalcaba, DO

## 2023-04-17 ENCOUNTER — Ambulatory Visit (HOSPITAL_COMMUNITY): Payer: Medicaid Other

## 2023-04-21 ENCOUNTER — Ambulatory Visit (HOSPITAL_COMMUNITY): Payer: Medicaid Other

## 2023-04-27 ENCOUNTER — Encounter: Payer: Self-pay | Admitting: Family

## 2023-04-27 ENCOUNTER — Encounter (INDEPENDENT_AMBULATORY_CARE_PROVIDER_SITE_OTHER): Payer: Medicaid Other | Admitting: Family

## 2023-04-27 NOTE — Progress Notes (Signed)
Erroneous encounter-disregard

## 2023-05-03 ENCOUNTER — Ambulatory Visit (HOSPITAL_COMMUNITY): Payer: Medicaid Other

## 2023-05-04 ENCOUNTER — Ambulatory Visit (HOSPITAL_COMMUNITY): Payer: Medicaid Other

## 2023-05-07 ENCOUNTER — Ambulatory Visit (HOSPITAL_COMMUNITY): Payer: Self-pay

## 2023-05-08 ENCOUNTER — Ambulatory Visit (HOSPITAL_COMMUNITY): Payer: Medicaid Other

## 2023-05-10 ENCOUNTER — Ambulatory Visit (HOSPITAL_COMMUNITY): Payer: Self-pay

## 2023-05-11 ENCOUNTER — Ambulatory Visit (HOSPITAL_COMMUNITY)
Admission: RE | Admit: 2023-05-11 | Discharge: 2023-05-11 | Disposition: A | Discharge status: Home / Self Care | Payer: Medicaid Other | Source: Ambulatory Visit | Attending: Emergency Medicine | Admitting: Emergency Medicine

## 2023-05-11 ENCOUNTER — Other Ambulatory Visit: Payer: Self-pay

## 2023-05-11 ENCOUNTER — Encounter (HOSPITAL_COMMUNITY): Payer: Self-pay

## 2023-05-11 VITALS — BP 135/91 | HR 70 | Temp 98.7°F | Resp 16

## 2023-05-11 DIAGNOSIS — Z789 Other specified health status: Secondary | ICD-10-CM | POA: Diagnosis not present

## 2023-05-11 DIAGNOSIS — N760 Acute vaginitis: Secondary | ICD-10-CM | POA: Insufficient documentation

## 2023-05-11 LAB — POCT URINALYSIS DIP (MANUAL ENTRY)
Bilirubin, UA: NEGATIVE
Blood, UA: NEGATIVE
Glucose, UA: NEGATIVE mg/dL
Ketones, POC UA: NEGATIVE mg/dL
Leukocytes, UA: NEGATIVE
Nitrite, UA: NEGATIVE
Protein Ur, POC: NEGATIVE mg/dL
Spec Grav, UA: 1.02 (ref 1.010–1.025)
Urobilinogen, UA: 0.2 U/dL
pH, UA: 6.5 (ref 5.0–8.0)

## 2023-05-11 LAB — POCT URINE PREGNANCY: Preg Test, Ur: NEGATIVE

## 2023-05-11 NOTE — Discharge Instructions (Signed)
 The results of your vaginal swab test which screens for BV, yeast, gonorrhea, chlamydia and trichomonas will be posted to your MyChart account once it is complete.  This typically takes 1 to 2 days.  Please abstain from sexual intercourse of any kind, vaginal, oral or anal, until you have received the results of your STD testing.    If any of your results are abnormal, you will receive a phone call regarding treatment.  Prescriptions, if any are needed, will be provided for you at your pharmacy.  If you test positive for bacterial vaginosis, we will be happy to send you metronidazole  vaginal gel for treatment.  Your urine pregnancy test today is negative.     Our point-of-care analysis of your urine sample today was normal and did not reveal any concern for urinary tract infection.     If you have not had complete resolution of your symptoms after completing any recommended treatment or if your symptoms worsen, please return for repeat evaluation.     Thank you for visiting [facility] today.  We appreciate the opportunity to participate in your care.

## 2023-05-11 NOTE — ED Triage Notes (Signed)
 Pt reports she has a clear vag. Discharge with an odor for a while. Pt wants the vag gel not pill for treatment.

## 2023-05-11 NOTE — ED Provider Notes (Signed)
 [facility]    CSN: [id number] Arrival date & time: 05/11/23  1102    HISTORY   Chief Complaint  Patient presents with   Vaginal Discharge    Entered by patient   HPI Paula [name] is a pleasant, 28 y.o. female who presents to urgent care today. Patient endorses abnormal vaginal odor, prior history of bacterial vaginosis infection, last episode was July 2024 and was treated with Metronidazole  500 mg twice daily for 7 days with complete resolution of symptoms.   Patient denies vaginal itching, vaginal irritation, dyspareunia, suprapubic pain, perineal pain, burning during urination, increased frequency of urination, fever , body aches, chills, rigors, malaise, and significant fatigue.   The history is provided by the patient.    Past Medical History:  Diagnosis Date   Asthma    Diabetes mellitus without complication [facility])    Patient Active Problem List   Diagnosis Date Noted   Back pain 03/27/2023   Neck pain 03/27/2023   Macromastia 08/31/2020   Morbid obesity (HCC) 12/17/2017   Menometrorrhagia 01/19/2014   General counseling for prescription of oral contraceptives 01/19/2014   MRSA (methicillin resistant staph aureus) culture positive 09/03/2011   Candida albicans infection 09/03/2011   Past Surgical History:  Procedure Laterality Date   GINGIVECTOMY  2010   TONSILECTOMY, ADENOIDECTOMY, BILATERAL MYRINGOTOMY AND TUBES     OB History     Gravida  0   Para  0   Term  0   Preterm  0   AB  0   Living  0      SAB  0   IAB  0   Ectopic  0   Multiple  0   Live Births             Home Medications    Prior to Admission medications   Medication Sig Start Date End Date Taking? Authorizing Provider  albuterol  (VENTOLIN  HFA) 108 (90 Base) MCG/ACT inhaler Inhale 1-2 puffs into the lungs every 6 (six) hours as needed for wheezing or shortness of breath. 01/08/22   Billy Asberry FALCON, PA-C  escitalopram  (LEXAPRO ) 10 MG tablet Take 1 tablet (10 mg  total) by mouth at bedtime. 02/09/23  Yes Lorren, Amy J, NP  fluconazole  (DIFLUCAN ) 200 MG tablet Take 1 tablet by mouth 2 (two) times daily.    [provider]  Fluocinolone Acetonide Scalp 0.01 % OIL Apply on scalp 2-3 times per week on scalp. Do not need to rinse 11/03/19   [provider]  fluticasone  (FLONASE ) 50 MCG/ACT nasal spray Place 2 sprays into both nostrils daily. Patient taking differently: Place 2 sprays into both nostrils daily as needed for allergies. 01/20/19  Yes Hall-Potvin, Brittany, PA-C  griseofulvin (GRIS-PEG) 250 MG tablet Take 2 tablets by mouth daily.   Yes [provider]  hydrocortisone 2.5 % cream USE TWICE DAILY ON FACE LESIONS   Yes [provider]  hydrOXYzine  (VISTARIL ) 25 MG capsule Take 1 capsule (25 mg total) by mouth every 8 (eight) hours as needed. 02/09/23  Yes Lorren, Amy J, NP  ibuprofen  (ADVIL ,MOTRIN ) 800 MG tablet Take 1 tablet (800 mg total) by mouth every 8 (eight) hours as needed. 01/23/18  Yes Zammit, Joseph, MD  ketoconazole (NIZORAL) 2 % shampoo Lather onto the scalp and allow to sit for 5 to 10 minutes, then rinse off. Do this 2 to 3 times per week. 11/03/19  Yes [provider]  metroNIDAZOLE  (FLAGYL ) 500 MG tablet Take 1  tablet (500 mg total) by mouth 2 (two) times daily. 11/19/22   LampteyAleene KIDD, MD  mupirocin  ointment (BACTROBAN ) 2 % Place 1 application into the nose 2 (two) times daily. 02/02/19   Aberman, Caroline C, PA-C  neomycin -polymyxin-hydrocortisone (CORTISPORIN) 3.5-10000-1 OTIC suspension Place 4 drops into the left ear 3 (three) times daily. 12/17/18   Hall-Potvin, Brittany, PA-C  norgestimate -ethinyl estradiol  (ORTHO-CYCLEN,SPRINTEC,PREVIFEM) 0.25-35 MG-MCG tablet Take 1 tablet by mouth daily. 10/07/17   Dove, Myra C, MD  nystatin  ointment (MYCOSTATIN ) APPLY TO THE AFFECTED AREA(S) BY TOPICAL ROUTE 2 TIMES PER DAY FOR 30 DAYS   Yes [provider]  ondansetron  (ZOFRAN -ODT) 4 MG  disintegrating tablet Take 1 tablet (4 mg total) by mouth every 8 (eight) hours as needed for nausea or vomiting. 01/10/23   Hazen Darryle BRAVO, FNP  pimecrolimus (ELIDEL) 1 % cream USE TWICE DAILY ON FACE LESIONS   Yes [provider]  triamcinolone  (KENALOG) 0.025 % ointment Apply topically 2 (two) times daily. 06/17/21  Yes [provider]    Family History Family History  Problem Relation Age of Onset   Hypertension Maternal Grandmother    Diabetes Father    Social History Social History   Tobacco Use   Smoking status: Never   Smokeless tobacco: Never  Vaping Use   Vaping status: Never Used  Substance Use Topics   Alcohol use: Yes    Comment: occasionally   Drug use: No   Allergies   Patient has no known allergies.  Review of Systems Review of Systems Pertinent findings revealed after performing a 14 point review of systems has been noted in the history of present illness.  Physical Exam Vital Signs BP (!) 135/91   Pulse 70   Temp 98.7 F (37.1 C)   Resp 16   LMP  (LMP Unknown)   SpO2 100%   No data found.  Physical Exam  Visual Acuity Right Eye Distance:   Left Eye Distance:   Bilateral Distance:    Right Eye Near:   Left Eye Near:    Bilateral Near:     UC Couse / Diagnostics / Procedures:     Radiology No results found.  Procedures Procedures (including critical care time) EKG  Pending results:  Labs Reviewed  POCT URINALYSIS DIP (MANUAL ENTRY)  POCT URINE PREGNANCY  CERVICOVAGINAL ANCILLARY ONLY    Medications Ordered in UC: Medications - No data to display  UC Diagnoses / Final Clinical Impressions(s)   I have reviewed the triage vital signs and the nursing notes.  Pertinent labs & imaging results that were available during my care of the patient were reviewed by me and considered in my medical decision making (see chart for details).    Final diagnoses:  Vaginitis and vulvovaginitis  Not currently pregnant   STD  screening was performed, patient advised that the results be posted to their MyChart and if any of the results are positive, they will be notified by phone, further treatment will be provided as indicated based on results of STD screening. Patient was advised to abstain from sexual intercourse until that they receive the results of their STD testing.  Patient was also advised to use condoms to protect themselves from STD exposure. If patient test positive for BV, we will provide her with vaginal metronidazole  gel for treatment, at her request. Urinalysis today was normal. Urine pregnancy test was negative. Return precautions advised.  Drug allergies reviewed, all questions addressed.   Please see discharge instructions  below for details of plan of care as provided to patient. ED Prescriptions   None    PDMP not reviewed this encounter.  Disposition Upon Discharge:  Condition: stable for discharge home  Patient presented with concern for an acute illness with associated systemic symptoms and significant discomfort requiring urgent management. In my opinion, this is a condition that a prudent lay person (someone who possesses an average knowledge of health and medicine) may potentially expect to result in complications if not addressed urgently such as respiratory distress, impairment of bodily function or dysfunction of bodily organs.   As such, the patient has been evaluated and assessed, work-up was performed and treatment was provided in alignment with urgent care protocols and evidence based medicine.  Patient/parent/caregiver has been advised that the patient may require follow up for further testing and/or treatment if the symptoms continue in spite of treatment, as clinically indicated and appropriate.  Routine symptom specific, illness specific and/or disease specific instructions were discussed with the patient and/or caregiver at length.  Prevention strategies for avoiding STD exposure  were also discussed.  The patient will follow up with their current PCP if and as advised. If the patient does not currently have a PCP we will assist them in obtaining one.   The patient may need specialty follow up if the symptoms continue, in spite of conservative treatment and management, for further workup, evaluation, consultation and treatment as clinically indicated and appropriate.  Patient/parent/caregiver verbalized understanding and agreement of plan as discussed.  All questions were addressed during visit.  Please see discharge instructions below for further details of plan.    Discharge Instructions      The results of your vaginal swab test which screens for BV, yeast, gonorrhea, chlamydia and trichomonas will be posted to your MyChart account once it is complete.  This typically takes 1 to 2 days.  Please abstain from sexual intercourse of any kind, vaginal, oral or anal, until you have received the results of your STD testing.    If any of your results are abnormal, you will receive a phone call regarding treatment.  Prescriptions, if any are needed, will be provided for you at your pharmacy.  If you test positive for bacterial vaginosis, we will be happy to send you metronidazole  vaginal gel for treatment.  Your urine pregnancy test today is negative.     Our point-of-care analysis of your urine sample today was normal and did not reveal any concern for urinary tract infection.     If you have not had complete resolution of your symptoms after completing any recommended treatment or if your symptoms worsen, please return for repeat evaluation.     Thank you for visiting [facility] today.  We appreciate the opportunity to participate in your care.       This office note has been dictated using Teaching laboratory technician.  Unfortunately, this method of dictation can sometimes lead to typographical or grammatical errors.  I apologize for your  inconvenience in advance if this occurs.  Please do not hesitate to reach out to me if clarification is needed.       Joesph Shaver Scales, PA-C 05/11/23 1227

## 2023-05-11 NOTE — ED Notes (Signed)
 Patient was not finished with registration when called to room

## 2023-05-12 LAB — CERVICOVAGINAL ANCILLARY ONLY
Bacterial Vaginitis (gardnerella): POSITIVE — AB
Candida Glabrata: NEGATIVE
Candida Vaginitis: NEGATIVE
Chlamydia: NEGATIVE
Comment: NEGATIVE
Comment: NEGATIVE
Comment: NEGATIVE
Comment: NEGATIVE
Comment: NEGATIVE
Comment: NORMAL
Neisseria Gonorrhea: NEGATIVE
Trichomonas: NEGATIVE

## 2023-05-13 ENCOUNTER — Other Ambulatory Visit: Payer: Self-pay | Admitting: Family

## 2023-05-13 ENCOUNTER — Telehealth (HOSPITAL_COMMUNITY): Payer: Self-pay | Admitting: Emergency Medicine

## 2023-05-13 ENCOUNTER — Encounter (HOSPITAL_COMMUNITY): Payer: Self-pay

## 2023-05-13 ENCOUNTER — Encounter (HOSPITAL_COMMUNITY): Payer: Self-pay | Admitting: Emergency Medicine

## 2023-05-13 MED ORDER — METRONIDAZOLE 500 MG PO TABS: 500.0000 mg | ORAL_TABLET | Freq: Two times a day (BID) | ORAL | 0 refills | Status: DC

## 2023-05-13 NOTE — Telephone Encounter (Signed)
 Metronidazole for positive BV, per protocol

## 2023-05-14 MED ORDER — METRONIDAZOLE 0.75 % VA GEL: 1.0000 | Freq: Every day | VAGINAL | 0 refills | Status: AC

## 2023-05-21 ENCOUNTER — Ambulatory Visit (HOSPITAL_COMMUNITY): Payer: Self-pay | Admitting: Student

## 2023-06-02 ENCOUNTER — Encounter (HOSPITAL_COMMUNITY): Payer: Self-pay

## 2023-06-04 ENCOUNTER — Ambulatory Visit (HOSPITAL_COMMUNITY): Payer: Medicaid Other | Admitting: Physician Assistant

## 2023-06-05 ENCOUNTER — Encounter: Payer: Medicaid Other | Admitting: Family

## 2023-06-05 NOTE — Progress Notes (Signed)
 Erroneous encounter-disregard

## 2023-06-18 ENCOUNTER — Ambulatory Visit (HOSPITAL_COMMUNITY)
Admission: RE | Admit: 2023-06-18 | Discharge: 2023-06-18 | Disposition: A | Discharge status: Home / Self Care | Payer: Medicaid Other | Source: Ambulatory Visit | Attending: Emergency Medicine | Admitting: Emergency Medicine

## 2023-06-18 ENCOUNTER — Other Ambulatory Visit: Payer: Self-pay

## 2023-06-18 ENCOUNTER — Encounter (HOSPITAL_COMMUNITY): Payer: Self-pay

## 2023-06-18 VITALS — BP 122/81 | HR 81 | Temp 98.4°F | Resp 20

## 2023-06-18 DIAGNOSIS — J309 Allergic rhinitis, unspecified: Secondary | ICD-10-CM

## 2023-06-18 DIAGNOSIS — J069 Acute upper respiratory infection, unspecified: Secondary | ICD-10-CM | POA: Diagnosis not present

## 2023-06-18 LAB — POC COVID19/FLU A&B COMBO
Covid Antigen, POC: NEGATIVE
Influenza A Antigen, POC: NEGATIVE
Influenza B Antigen, POC: NEGATIVE

## 2023-06-18 MED ORDER — FLUTICASONE PROPIONATE 50 MCG/ACT NA SUSP: 1.0000 | Freq: Every day | NASAL | 2 refills | Status: DC

## 2023-06-18 MED ORDER — CETIRIZINE HCL 10 MG PO TABS: 10.0000 mg | ORAL_TABLET | Freq: Every day | ORAL | 1 refills | Status: DC

## 2023-06-18 NOTE — ED Provider Notes (Signed)
 [facility]    CSN: [id number] Arrival date & time: 06/18/23  1833    HISTORY   Chief Complaint  Patient presents with   Sore Throat    Entered by patient   Chills   Generalized Body Aches   HPI Paula RIESEN is a pleasant, 28 y.o. female who presents to urgent care today. Patient complains of congestion, scratchy throat and mild, nonproductive cough that began today.  Patient states she has not tried anything to alleviate her symptoms.  Patient adds that she is also noticed that she is having bodyaches.  Patient has normal vital signs on arrival, denies known sick contacts, fever, chills, nausea, vomiting, diarrhea.  The history is provided by the patient.   Past Medical History:  Diagnosis Date   Asthma    Diabetes mellitus without complication [facility])    Patient Active Problem List   Diagnosis Date Noted   Back pain 03/27/2023   Neck pain 03/27/2023   Macromastia 08/31/2020   Morbid obesity (HCC) 12/17/2017   Menometrorrhagia 01/19/2014   General counseling for prescription of oral contraceptives 01/19/2014   MRSA (methicillin resistant staph aureus) culture positive 09/03/2011   Candida albicans infection 09/03/2011   Past Surgical History:  Procedure Laterality Date   GINGIVECTOMY  2010   TONSILECTOMY, ADENOIDECTOMY, BILATERAL MYRINGOTOMY AND TUBES     OB History     Gravida  0   Para  0   Term  0   Preterm  0   AB  0   Living  0      SAB  0   IAB  0   Ectopic  0   Multiple  0   Live Births             Home Medications    Prior to Admission medications   Medication Sig Start Date End Date Taking? Authorizing Provider  cetirizine (ZYRTEC ALLERGY) 10 MG tablet Take 1 tablet (10 mg total) by mouth at bedtime. 06/18/23 12/15/23 Yes Theadora Rama Scales, PA-C  fluticasone (FLONASE) 50 MCG/ACT nasal spray Place 1 spray into both nostrils daily. Begin by using 2 sprays in each nare daily for 3 to 5 days, then decrease to 1 spray  in each nare daily. 06/18/23  Yes Theadora Rama Scales, PA-C  hydrOXYzine (VISTARIL) 25 MG capsule Take 1 capsule (25 mg total) by mouth every 8 (eight) hours as needed. 02/09/23  Yes Zonia Kief, Amy J, NP  ibuprofen (ADVIL,MOTRIN) 800 MG tablet Take 1 tablet (800 mg total) by mouth every 8 (eight) hours as needed. 01/23/18  Yes Bethann Berkshire, MD    Family History Family History  Problem Relation Age of Onset   Hypertension Maternal Grandmother    Diabetes Father    Social History Social History   Tobacco Use   Smoking status: Never   Smokeless tobacco: Never  Vaping Use   Vaping status: Never Used  Substance Use Topics   Alcohol use: Yes    Comment: occasionally   Drug use: No   Allergies   Patient has no known allergies.  Review of Systems Review of Systems Pertinent findings revealed after performing a 14 point review of systems has been noted in the history of present illness.  Physical Exam Vital Signs BP 122/81   Pulse 81   Temp 98.4 F (36.9 C)   Resp 20   LMP 06/18/2023   SpO2 97%   No data found.  Physical Exam Vitals and nursing note reviewed.  Constitutional:      General: She is not in acute distress.    Appearance: Normal appearance. She is not ill-appearing.  HENT:     Head: Normocephalic and atraumatic.     Salivary Glands: Right salivary gland is not diffusely enlarged or tender. Left salivary gland is not diffusely enlarged or tender.     Right Ear: Ear canal and external ear normal. No drainage. A middle ear effusion is present. There is no impacted cerumen. Tympanic membrane is bulging. Tympanic membrane is not injected or erythematous.     Left Ear: Ear canal and external ear normal. No drainage. A middle ear effusion is present. There is no impacted cerumen. Tympanic membrane is bulging. Tympanic membrane is not injected or erythematous.     Ears:     Comments: Bilateral EACs normal, both TMs bulging with clear fluid    Nose: Rhinorrhea  present. No nasal deformity, septal deviation, signs of injury, nasal tenderness, mucosal edema or congestion. Rhinorrhea is clear.     Right Nostril: Occlusion present. No foreign body, epistaxis or septal hematoma.     Left Nostril: Occlusion present. No foreign body, epistaxis or septal hematoma.     Right Turbinates: Enlarged, swollen and pale.     Left Turbinates: Enlarged, swollen and pale.     Right Sinus: No maxillary sinus tenderness or frontal sinus tenderness.     Left Sinus: No maxillary sinus tenderness or frontal sinus tenderness.     Mouth/Throat:     Lips: Pink. No lesions.     Mouth: Mucous membranes are moist. No oral lesions.     Pharynx: Oropharynx is clear. Uvula midline. No posterior oropharyngeal erythema or uvula swelling.     Tonsils: No tonsillar exudate. 0 on the right. 0 on the left.     Comments: Postnasal drip Eyes:     General: Lids are normal.        Right eye: No discharge.        Left eye: No discharge.     Extraocular Movements: Extraocular movements intact.     Conjunctiva/sclera: Conjunctivae normal.     Right eye: Right conjunctiva is not injected.     Left eye: Left conjunctiva is not injected.  Neck:     Trachea: Trachea and phonation normal.  Cardiovascular:     Rate and Rhythm: Normal rate and regular rhythm.     Pulses: Normal pulses.     Heart sounds: Normal heart sounds. No murmur heard.    No friction rub. No gallop.  Pulmonary:     Effort: Pulmonary effort is normal. No accessory muscle usage, prolonged expiration or respiratory distress.     Breath sounds: Normal breath sounds. No stridor, decreased air movement or transmitted upper airway sounds. No decreased breath sounds, wheezing, rhonchi or rales.  Chest:     Chest wall: No tenderness.  Musculoskeletal:        General: Normal range of motion.     Cervical back: Normal range of motion and neck supple. Normal range of motion.  Lymphadenopathy:     Cervical: No cervical  adenopathy.  Skin:    General: Skin is warm and dry.     Findings: No erythema or rash.  Neurological:     General: No focal deficit present.     Mental Status: She is alert and oriented to person, place, and time.  Psychiatric:        Mood and Affect: Mood normal.  Behavior: Behavior normal.     Visual Acuity Right Eye Distance:   Left Eye Distance:   Bilateral Distance:    Right Eye Near:   Left Eye Near:    Bilateral Near:     UC Couse / Diagnostics / Procedures:     Radiology No results found.  Procedures Procedures (including critical care time) EKG  Pending results:  Labs Reviewed  POC COVID19/FLU A&B COMBO    Medications Ordered in UC: Medications - No data to display  UC Diagnoses / Final Clinical Impressions(s)   I have reviewed the triage vital signs and the nursing notes.  Pertinent labs & imaging results that were available during my care of the patient were reviewed by me and considered in my medical decision making (see chart for details).    Final diagnoses:  Viral URI with cough  Allergic rhinitis, unspecified seasonality, unspecified trigger   Patient advised of negative influenza and COVID-19 tests.  No further testing indicated.  Patient advised that physical exam findings are consistent with allergic rhinitis and that she is likely suffering from a viral URI such as rhinosinusitis.  Patient provided with allergy medications to alleviate symptoms.  Conservative care recommended.  Return precautions advised.    Please see discharge instructions below for details of plan of care as provided to patient. ED Prescriptions     Medication Sig Dispense Auth. Provider   cetirizine (ZYRTEC ALLERGY) 10 MG tablet Take 1 tablet (10 mg total) by mouth at bedtime. 90 tablet Theadora Rama Scales, PA-C   fluticasone (FLONASE) 50 MCG/ACT nasal spray Place 1 spray into both nostrils daily. Begin by using 2 sprays in each nare daily for 3 to 5 days, then  decrease to 1 spray in each nare daily. 15.8 mL Theadora Rama Scales, PA-C      PDMP not reviewed this encounter.  Pending results:  Labs Reviewed  POC COVID19/FLU A&B COMBO      Discharge Instructions      Your symptoms and physical exam findings are concerning for a viral respiratory infection.  Because respiratory allergies are not well controlled at this time, this makes you more susceptible to catching respiratory infections.   To avoid catching frequent respiratory infections, having skin reactions, dealing with eye irritation, losing sleep, missing work, etc., due to uncontrolled allergies, it is important that you begin/continue your allergy regimen and are consistent with taking your meds exactly as prescribed.   Please read below to learn more about the medications, dosages and frequencies that I recommend to help alleviate your symptoms and to get you feeling better soon:   Zyrtec (cetirizine): This is an excellent second-generation antihistamine that helps to reduce respiratory inflammatory response to environmental allergens.  In some patients, this medication can cause daytime sleepiness so I recommend that you take 1 tablet daily at bedtime.   Flonase (fluticasone): This is a steroid nasal spray that used once daily, 1 spray in each nare.  This works best when used on a daily basis. This medication does not work well if it is only used when you think you need it.  After 3 to 5 days of use, you will notice significant reduction of the inflammation and mucus production that is currently being caused by exposure to allergens, whether seasonal or environmental.  The most common side effect of this medication is nosebleeds.  If you experience a nosebleed, please discontinue use for 1 week, then feel free to resume.  If you find that your  insurance will not pay for this medication, please consider a different nasal steroids such as Nasonex (mometasone), or Nasacort  (triamcinolone).   If symptoms have not meaningfully improved in the next 5 to 7 days, please return for repeat evaluation or follow-up with your regular provider.  If symptoms have worsened in the next 3 to 5 days, please return for repeat evaluation or follow-up with your regular provider.    Thank you for visiting urgent care today.  We appreciate the opportunity to participate in your care.     Disposition Upon Discharge:  Condition: stable for discharge home  Patient presented with an acute illness with associated systemic symptoms and significant discomfort requiring urgent management. In my opinion, this is a condition that a prudent lay person (someone who possesses an average knowledge of health and medicine) may potentially expect to result in complications if not addressed urgently such as respiratory distress, impairment of bodily function or dysfunction of bodily organs.   Routine symptom specific, illness specific and/or disease specific instructions were discussed with the patient and/or caregiver at length.   As such, the patient has been evaluated and assessed, work-up was performed and treatment was provided in alignment with urgent care protocols and evidence based medicine.  Patient/parent/caregiver has been advised that the patient may require follow up for further testing and treatment if the symptoms continue in spite of treatment, as clinically indicated and appropriate.  Patient/parent/caregiver has been advised to return to the [facility] or PCP if no better; to PCP or the Emergency Department if new signs and symptoms develop, or if the current signs or symptoms continue to change or worsen for further workup, evaluation and treatment as clinically indicated and appropriate  The patient will follow up with their current PCP if and as advised. If the patient does not currently have a PCP we will assist them in obtaining one.   The patient may need specialty follow up if the  symptoms continue, in spite of conservative treatment and management, for further workup, evaluation, consultation and treatment as clinically indicated and appropriate.  Patient/parent/caregiver verbalized understanding and agreement of plan as discussed.  All questions were addressed during visit.  Please see discharge instructions below for further details of plan.  This office note has been dictated using Teaching laboratory technician.  Unfortunately, this method of dictation can sometimes lead to typographical or grammatical errors.  I apologize for your inconvenience in advance if this occurs.  Please do not hesitate to reach out to me if clarification is needed.      Theadora Rama Scales, PA-C 06/19/23 1323

## 2023-06-18 NOTE — ED Triage Notes (Signed)
PT reports congestion,sore throat Sx's started today. Pt also has body aches.

## 2023-06-18 NOTE — Discharge Instructions (Signed)
Your symptoms and physical exam findings are concerning for a viral respiratory infection.  Because respiratory allergies are not well controlled at this time, this makes you more susceptible to catching respiratory infections.   To avoid catching frequent respiratory infections, having skin reactions, dealing with eye irritation, losing sleep, missing work, etc., due to uncontrolled allergies, it is important that you begin/continue your allergy regimen and are consistent with taking your meds exactly as prescribed.   Please read below to learn more about the medications, dosages and frequencies that I recommend to help alleviate your symptoms and to get you feeling better soon:   Zyrtec (cetirizine): This is an excellent second-generation antihistamine that helps to reduce respiratory inflammatory response to environmental allergens.  In some patients, this medication can cause daytime sleepiness so I recommend that you take 1 tablet daily at bedtime.   Flonase (fluticasone): This is a steroid nasal spray that used once daily, 1 spray in each nare.  This works best when used on a daily basis. This medication does not work well if it is only used when you think you need it.  After 3 to 5 days of use, you will notice significant reduction of the inflammation and mucus production that is currently being caused by exposure to allergens, whether seasonal or environmental.  The most common side effect of this medication is nosebleeds.  If you experience a nosebleed, please discontinue use for 1 week, then feel free to resume.  If you find that your insurance will not pay for this medication, please consider a different nasal steroids such as Nasonex (mometasone), or Nasacort (triamcinolone).   If symptoms have not meaningfully improved in the next 5 to 7 days, please return for repeat evaluation or follow-up with your regular provider.  If symptoms have worsened in the next 3 to 5 days, please return for repeat  evaluation or follow-up with your regular provider.    Thank you for visiting urgent care today.  We appreciate the opportunity to participate in your care.

## 2023-07-07 ENCOUNTER — Ambulatory Visit (HOSPITAL_COMMUNITY): Payer: Self-pay | Admitting: Mental Health

## 2023-07-09 ENCOUNTER — Ambulatory Visit (HOSPITAL_COMMUNITY)

## 2023-07-14 ENCOUNTER — Ambulatory Visit (HOSPITAL_COMMUNITY)

## 2023-07-16 ENCOUNTER — Ambulatory Visit (HOSPITAL_COMMUNITY): Payer: Medicaid Other | Admitting: Physician Assistant

## 2023-07-18 ENCOUNTER — Ambulatory Visit (HOSPITAL_COMMUNITY)

## 2023-07-20 ENCOUNTER — Ambulatory Visit: Payer: Self-pay
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
## 2023-07-22 ENCOUNTER — Ambulatory Visit (HOSPITAL_COMMUNITY): Payer: Medicaid Other | Admitting: Psychiatry

## 2023-07-22 ENCOUNTER — Encounter (HOSPITAL_COMMUNITY): Payer: Self-pay | Admitting: Psychiatry

## 2023-07-22 DIAGNOSIS — F4381 Prolonged grief disorder: Secondary | ICD-10-CM | POA: Diagnosis not present

## 2023-07-22 DIAGNOSIS — F330 Major depressive disorder, recurrent, mild: Secondary | ICD-10-CM

## 2023-07-22 NOTE — Patient Instructions (Addendum)
 Thank you for attending your appointment today.  -- We did not start any medications today. As discussed, we will focus on therapy first and you and your therapist can reassess if medications should be considered in the future.  Please do not make any changes to medications without first discussing with your provider. If you are experiencing a psychiatric emergency, please call 911 or present to your nearest emergency department. Additional crisis, medication management, and therapy resources are included below.  [facility]  [address] [place], [place] [address] [phone] WALK-IN URGENT CARE 24/7 FOR ANYONE [address], [place], [place]  [phone] Fax: [phone] [email or web address] *Interpreters available *Accepts all insurance and uninsured for Urgent Care needs *Accepts Medicaid and uninsured for outpatient treatment (below)      ONLY FOR [facility]  Below:    Outpatient New Patient Assessment/Therapy Walk-ins:        Monday, Wednesday, and Thursday 8am until slots are full (first come, first served)                   New Patient Psychiatry/Medication Management        Monday-Friday 8am-11am (first come, first served)               For all walk-ins we ask that you arrive by 7:15am, because patients will be seen in the order of arrival.

## 2023-07-23 DIAGNOSIS — F330 Major depressive disorder, recurrent, mild: Secondary | ICD-10-CM | POA: Insufficient documentation

## 2023-07-23 DIAGNOSIS — F4381 Prolonged grief disorder: Secondary | ICD-10-CM | POA: Insufficient documentation

## 2023-08-03 ENCOUNTER — Encounter (HOSPITAL_COMMUNITY): Payer: Self-pay

## 2023-08-03 ENCOUNTER — Telehealth (HOSPITAL_COMMUNITY): Payer: Self-pay | Admitting: Family Medicine

## 2023-08-03 ENCOUNTER — Ambulatory Visit (HOSPITAL_COMMUNITY)
Admission: RE | Admit: 2023-08-03 | Discharge: 2023-08-03 | Disposition: A | Discharge status: Home / Self Care | Source: Ambulatory Visit | Attending: Family Medicine | Admitting: Family Medicine

## 2023-08-03 VITALS — BP 139/94 | HR 84 | Temp 98.1°F | Resp 16

## 2023-08-03 DIAGNOSIS — N760 Acute vaginitis: Secondary | ICD-10-CM | POA: Diagnosis present

## 2023-08-03 DIAGNOSIS — R519 Headache, unspecified: Secondary | ICD-10-CM | POA: Insufficient documentation

## 2023-08-03 HISTORY — DX: Psoriasis, unspecified: L40.9

## 2023-08-03 LAB — POCT FASTING CBG KUC MANUAL ENTRY: POCT Glucose (KUC): 97 mg/dL (ref 70–99)

## 2023-08-03 MED ORDER — KETOROLAC TROMETHAMINE 30 MG/ML IJ SOLN
INTRAMUSCULAR | Status: AC
  Filled 2023-08-03: qty 1

## 2023-08-03 MED ORDER — KETOROLAC TROMETHAMINE 30 MG/ML IJ SOLN
30.0000 mg | Freq: Once | INTRAMUSCULAR | Status: AC
  Administered 2023-08-03: 30 mg via INTRAMUSCULAR

## 2023-08-03 MED ORDER — KETOROLAC TROMETHAMINE 10 MG PO TABS: 10.0000 mg | ORAL_TABLET | Freq: Four times a day (QID) | ORAL | 0 refills | Status: DC | PRN

## 2023-08-03 NOTE — Telephone Encounter (Signed)
 Swab ordered for vaginitis, meant to order in clinic visit earlier today

## 2023-08-03 NOTE — Discharge Instructions (Signed)
 Your sugar was 97, which is normal  You have been given a shot of Toradol 30 mg today.  Ketorolac 10 mg tablets--take 1 tablet every 6 hours as needed for pain.  This is the same medicine that is in the shot we just gave you  Staff will notify you if there is anything positive on the swab.  Please follow-up with your primary care about these issues

## 2023-08-03 NOTE — ED Provider Notes (Signed)
 [facility]    CSN: [id number] Arrival date & time: 08/03/23  1331      History   Chief Complaint Chief Complaint  Patient presents with   Headache    Entered by patient   Vaginal Itching    HPI Paula [name] is a 28 y.o. female.    Headache Vaginal Itching Associated symptoms include headaches.  Here for squeezing Headache that is been bothering her for several months.  At times she will have blurry vision.  It mostly does not throb though it can occasionally.  No associated photophobia or nausea or vomiting.  She has had some vaginal itching and discharge in the last 2 days.  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   Back pain 03/27/2023   Neck pain 03/27/2023   Macromastia 08/31/2020   Morbid obesity (HCC) 12/17/2017   Menometrorrhagia 01/19/2014   General counseling for prescription of oral contraceptives 01/19/2014   MRSA (methicillin resistant staph aureus) culture positive 09/03/2011   Candida albicans infection 09/03/2011    Past Surgical History:  Procedure Laterality Date   GINGIVECTOMY  2010   TONSILECTOMY, ADENOIDECTOMY, BILATERAL MYRINGOTOMY AND TUBES      OB History     Gravida  0   Para  0   Term  0   Preterm  0   AB  0   Living  0      SAB  0   IAB  0   Ectopic  0   Multiple  0   Live Births               Home Medications    Prior to Admission medications   Medication Sig Start Date End Date Taking? Authorizing Provider  ketorolac (TORADOL) 10 MG tablet Take 1 tablet (10 mg total) by mouth every 6 (six) hours as needed (pain). 08/03/23  Yes Zenia Resides, MD  albuterol (VENTOLIN HFA) 108 (90 Base) MCG/ACT inhaler Inhale 1 puff into the lungs every 6 (six) hours as needed for shortness of breath. 08/24/20   [provider]  cetirizine (ZYRTEC ALLERGY) 10 MG tablet Take 1 tablet (10 mg total) by mouth at bedtime. Patient not taking: Reported on 07/22/2023 06/18/23 12/15/23  Theadora Rama Scales, PA-C  desonide (DESOWEN) 0.05 % cream Apply 1 Application topically 2 (two) times daily as needed.    [provider]  fluticasone (FLONASE) 50 MCG/ACT nasal spray Place 1 spray into both nostrils daily. Begin by using 2 sprays in each nare daily for 3 to 5 days, then decrease to 1 spray in each nare daily. 06/18/23   Theadora Rama Scales, PA-C  nystatin cream (MYCOSTATIN) Apply 1 Application topically 2 (two) times daily as needed (psoriasis).    [provider]    Family History Family History  Problem Relation Age of Onset   Diabetes Father    ADD / ADHD Sister    Hypertension Maternal Grandmother     Social History Social History   Tobacco Use   Smoking status: Never   Smokeless tobacco: Never  Vaping Use  Vaping status: Never Used  Substance Use Topics   Alcohol use: Yes    Comment: occasionally   Drug use: Yes    Types: Marijuana     Allergies   Patient has no known allergies.   Review of Systems Review of Systems  Neurological:  Positive for headaches.     Physical Exam Triage Vital Signs ED Triage Vitals [08/03/23 1346]  Encounter Vitals Group     BP (!) 139/94     Systolic BP Percentile      Diastolic BP Percentile      Pulse Rate 84     Resp 16     Temp 98.1 F (36.7 C)     Temp Source Oral     SpO2 96 %     Weight      Height      Head Circumference      Peak Flow      Pain Score 10     Pain Loc      Pain Education      Exclude from Growth Chart    No data found.  Updated Vital Signs BP (!) 139/94 (BP Location: Left Arm)   Pulse 84   Temp 98.1 F (36.7 C) (Oral)    Resp 16   LMP 07/05/2023 (Approximate)   SpO2 96%   Visual Acuity Right Eye Distance:   Left Eye Distance:   Bilateral Distance:    Right Eye Near:   Left Eye Near:    Bilateral Near:     Physical Exam Vitals reviewed.  Constitutional:      General: She is not in acute distress.    Appearance: She is not ill-appearing, toxic-appearing or diaphoretic.  HENT:     Right Ear: Tympanic membrane and ear canal normal.     Left Ear: Tympanic membrane and ear canal normal.     Nose: Nose normal.     Mouth/Throat:     Mouth: Mucous membranes are moist.  Eyes:     Extraocular Movements: Extraocular movements intact.     Conjunctiva/sclera: Conjunctivae normal.     Pupils: Pupils are equal, round, and reactive to light.  Cardiovascular:     Rate and Rhythm: Normal rate and regular rhythm.     Heart sounds: No murmur heard. Pulmonary:     Effort: Pulmonary effort is normal.     Breath sounds: Normal breath sounds.  Abdominal:     General: There is no distension.     Palpations: Abdomen is soft.     Tenderness: There is no abdominal tenderness. There is no guarding.  Musculoskeletal:     Cervical back: Neck supple.  Lymphadenopathy:     Cervical: No cervical adenopathy.  Skin:    Coloration: Skin is not jaundiced or pale.  Neurological:     General: No focal deficit present.     Mental Status: She is alert and oriented to person, place, and time.     Cranial Nerves: No cranial nerve deficit.     Sensory: No sensory deficit.     Motor: No weakness.     Coordination: Coordination normal.     Gait: Gait normal.  Psychiatric:        Behavior: Behavior normal.      UC Treatments / Results  Labs (all labs ordered are listed, but only abnormal results are displayed) Labs Reviewed  POCT FASTING CBG [facility] MANUAL ENTRY    EKG   Radiology No results found.  Procedures  Procedures (including critical care time)  Medications Ordered in UC Medications  ketorolac (TORADOL)  30 MG/ML injection 30 mg (has no administration in time range)    Initial Impression / Assessment and Plan / UC Course  I have reviewed the triage vital signs and the nursing notes.  Pertinent labs & imaging results that were available during my care of the patient were reviewed by me and considered in my medical decision making (see chart for details).     Fingerstick glucose is 97.  Toradol injection is given here for headache.  She may be having a chronic daily headache.  Toradol tablets are sent to the pharmacy.  I have asked her to follow-up with her primary care.  Vaginal self swab is done, and we will notify of any positives on that and treat per protocol.  Final Clinical Impressions(s) / UC Diagnoses   Final diagnoses:  Chronic daily headache  Acute vaginitis     Discharge Instructions      Your sugar was 97, which is normal  You have been given a shot of Toradol 30 mg today.  Ketorolac 10 mg tablets--take 1 tablet every 6 hours as needed for pain.  This is the same medicine that is in the shot we just gave you  Staff will notify you if there is anything positive on the swab.  Please follow-up with your primary care about these issues     ED Prescriptions     Medication Sig Dispense Auth. Provider   ketorolac (TORADOL) 10 MG tablet Take 1 tablet (10 mg total) by mouth every 6 (six) hours as needed (pain). 20 tablet Jabarri Stefanelli, Janace Aris, MD      PDMP not reviewed this encounter.   Zenia Resides, MD 08/03/23 [phone]

## 2023-08-03 NOTE — ED Triage Notes (Signed)
 Patient c/o a "squeezing headache and " intermittent blurred vision x months."  Patient also c/o vaginal itching  and a "white slightly clumpy" vaginal discharge x 2 days.

## 2023-08-04 ENCOUNTER — Telehealth (HOSPITAL_COMMUNITY): Payer: Self-pay

## 2023-08-04 ENCOUNTER — Ambulatory Visit (HOSPITAL_COMMUNITY)

## 2023-08-04 LAB — CERVICOVAGINAL ANCILLARY ONLY
Bacterial Vaginitis (gardnerella): NEGATIVE
Candida Glabrata: NEGATIVE
Candida Vaginitis: POSITIVE — AB
Chlamydia: NEGATIVE
Comment: NEGATIVE
Comment: NEGATIVE
Comment: NEGATIVE
Comment: NEGATIVE
Comment: NEGATIVE
Comment: NORMAL
Neisseria Gonorrhea: NEGATIVE
Trichomonas: NEGATIVE

## 2023-08-04 MED ORDER — FLUCONAZOLE 150 MG PO TABS: 150.0000 mg | ORAL_TABLET | Freq: Once | ORAL | 0 refills | Status: AC

## 2023-08-04 NOTE — Telephone Encounter (Signed)
 Per protocol, pt requires tx with Diflucan.  Rx sent to pharmacy on file.

## 2023-08-11 ENCOUNTER — Ambulatory Visit (HOSPITAL_COMMUNITY): Payer: Medicaid Other | Admitting: Mental Health

## 2023-08-31 ENCOUNTER — Encounter: Payer: Medicaid Other | Admitting: Family

## 2023-09-21 ENCOUNTER — Ambulatory Visit (HOSPITAL_COMMUNITY): Admitting: Mental Health

## 2023-10-09 ENCOUNTER — Ambulatory Visit (HOSPITAL_COMMUNITY): Payer: Self-pay

## 2023-10-09 ENCOUNTER — Ambulatory Visit
Admission: RE | Admit: 2023-10-09 | Discharge: 2023-10-09 | Disposition: A | Discharge status: Home / Self Care | Source: Ambulatory Visit | Attending: Family Medicine | Admitting: Family Medicine

## 2023-10-09 ENCOUNTER — Ambulatory Visit (HOSPITAL_COMMUNITY)

## 2023-10-09 VITALS — BP 131/86 | HR 79 | Temp 98.3°F | Resp 20 | Ht 64.0 in | Wt 240.0 lb

## 2023-10-09 DIAGNOSIS — R109 Unspecified abdominal pain: Secondary | ICD-10-CM | POA: Insufficient documentation

## 2023-10-09 DIAGNOSIS — J4521 Mild intermittent asthma with (acute) exacerbation: Secondary | ICD-10-CM | POA: Diagnosis present

## 2023-10-09 LAB — POCT URINALYSIS DIP (MANUAL ENTRY)
Bilirubin, UA: NEGATIVE
Glucose, UA: NEGATIVE mg/dL
Ketones, POC UA: NEGATIVE mg/dL
Leukocytes, UA: NEGATIVE
Nitrite, UA: NEGATIVE
Protein Ur, POC: NEGATIVE mg/dL
Spec Grav, UA: 1.02 (ref 1.010–1.025)
Urobilinogen, UA: 0.2 U/dL
pH, UA: 7.5 (ref 5.0–8.0)

## 2023-10-09 MED ORDER — PREDNISONE 20 MG PO TABS: 40.0000 mg | ORAL_TABLET | Freq: Every day | ORAL | 0 refills | Status: AC

## 2023-10-09 MED ORDER — CEFDINIR 300 MG PO CAPS: 600.0000 mg | ORAL_CAPSULE | Freq: Every day | ORAL | 0 refills | Status: AC

## 2023-10-09 MED ORDER — CETIRIZINE HCL 10 MG PO TABS
10.0000 mg | ORAL_TABLET | Freq: Every day | ORAL | 0 refills | Status: DC | PRN
Start: 2023-10-09 — End: 2023-11-13

## 2023-10-09 MED ORDER — ALBUTEROL SULFATE HFA 108 (90 BASE) MCG/ACT IN AERS: 2.0000 | INHALATION_SPRAY | RESPIRATORY_TRACT | 0 refills | Status: DC | PRN

## 2023-10-09 NOTE — Discharge Instructions (Signed)
 The urinalysis showed a little bit of red blood cells.  Take cefdinir 300 mg--2 capsules together daily for 7 days  Urine culture is sent, and staff will notify you that it looks like the antibiotic needs to be changed.  Take prednisone  20 mg--2 daily for 5 days  Albuterol  inhaler--do 2 puffs every 4 hours as needed for shortness of breath or wheezing  Zyrtec /cetirizine  10 mg tablet--take 1 daily as needed for allergy or itching.   Please follow-up with your primary care about the asthma issue.

## 2023-10-09 NOTE — ED Triage Notes (Signed)
"  My kidneys on my right side started hurting Wednesday and peeing a lot with feeling uncomfortable to pee". No dysuria. No fever known.   "Also, I am having this cough that is getting more persistent, I do smoke and I do have asthma". No sob. No wheezing.

## 2023-10-09 NOTE — ED Provider Notes (Signed)
 [name]    CSN: [id number] Arrival date & time: 10/09/23  1058      History   Chief Complaint Chief Complaint  Patient presents with   Asthma Exacerbation   UTI Symptoms    HPI Paula [name] is a 28 y.o. female.   HPI Here for pain on her right flank that began bothering her 2 days ago on June 4.  She feels uncomfortable there when she urinates.  No external dysuria and no fever or vomiting.  She has been having a cough off and on for the last year.  It has been fairly persistent in the last month and she is having to use an asthma inhaler a few times a day.  She does smoke.  The inhaler she is using is over-the-counter, and apparently a Primatene Mist.  She has used an albuterol  inhaler in the past.   NKDA Last menstrual cycle May 12.  She has had some prolonged nasal congestion and postnasal drainage also.  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   Back pain 03/27/2023   Neck pain 03/27/2023   Macromastia 08/31/2020   Morbid obesity (HCC) 12/17/2017   Menometrorrhagia 01/19/2014   General counseling for prescription of oral contraceptives 01/19/2014   MRSA (methicillin resistant staph aureus) culture positive 09/03/2011   Candida albicans infection 09/03/2011    Past Surgical History:  Procedure Laterality Date   GINGIVECTOMY  2010   TONSILECTOMY, ADENOIDECTOMY, BILATERAL MYRINGOTOMY AND TUBES      OB History     Gravida  0   Para  0   Term  0   Preterm  0   AB  0   Living  0      SAB  0   IAB  0   Ectopic  0   Multiple  0   Live Births               Home Medications    Prior to Admission medications   Medication Sig Start Date End Date Taking? Authorizing  Provider  cefdinir (OMNICEF) 300 MG capsule Take 2 capsules (600 mg total) by mouth daily for 7 days. 10/09/23 10/16/23 Yes Ann Keto, MD  cetirizine  (ZYRTEC  ALLERGY) 10 MG tablet Take 1 tablet (10 mg total) by mouth daily as needed for allergies. 10/09/23  Yes Prem Coykendall K, MD  ketoconazole (NIZORAL) 2 % shampoo Apply 1 Application topically as directed. 08/04/23  Yes [provider]  predniSONE  (DELTASONE ) 20 MG tablet Take 2 tablets (40 mg total) by mouth daily with breakfast for 5 days. 10/09/23 10/14/23 Yes Alexio Sroka, Paige Boatman, MD  albuterol  (VENTOLIN  HFA) 108 [phone] Base) MCG/ACT inhaler Inhale 2 puffs into the lungs every 4 (four) hours as needed for shortness of breath or wheezing. 10/09/23   Keyondre Hepburn K, MD  desonide (DESOWEN) 0.05 % cream Apply 1 Application topically 2 (two) times daily as needed.    [provider]  fluconazole  (DIFLUCAN ) 150 MG tablet Take 150 mg by mouth once.    [provider]  fluticasone  (FLONASE ) 50 MCG/ACT nasal spray Place 1 spray into both nostrils daily. Begin by using 2 sprays in each nare daily for 3 to  5 days, then decrease to 1 spray in each nare daily. 06/18/23   Eloise Hake Scales, PA-C  ketorolac  (TORADOL ) 10 MG tablet Take 1 tablet (10 mg total) by mouth every 6 (six) hours as needed (pain). 08/03/23   Indigo Barbian K, MD  nystatin  cream (MYCOSTATIN ) Apply 1 Application topically 2 (two) times daily as needed (psoriasis).    [provider]    Family History Family History  Problem Relation Age of Onset   Diabetes Father    ADD / ADHD Sister    Hypertension Maternal Grandmother     Social History Social History   Tobacco Use   Smoking status: Never   Smokeless tobacco: Never  Vaping Use   Vaping status: Never Used  Substance Use Topics   Alcohol use: Yes    Comment: occasionally   Drug use: Yes    Types: Marijuana     Allergies   Patient has no known allergies.   Review of  Systems Review of Systems   Physical Exam Triage Vital Signs ED Triage Vitals  Encounter Vitals Group     BP 10/09/23 1112 131/86     Systolic BP Percentile --      Diastolic BP Percentile --      Pulse Rate 10/09/23 1112 79     Resp 10/09/23 1112 20     Temp 10/09/23 1112 98.3 F (36.8 C)     Temp Source 10/09/23 1112 Oral     SpO2 10/09/23 1112 98 %     Weight 10/09/23 1109 240 lb (108.9 kg)     Height 10/09/23 1109 5\' 4"  (1.626 m)     Head Circumference --      Peak Flow --      Pain Score 10/09/23 1102 0     Pain Loc --      Pain Education --      Exclude from Growth Chart --    No data found.  Updated Vital Signs BP 131/86 (BP Location: Left Arm)   Pulse 79   Temp 98.3 F (36.8 C) (Oral)   Resp 20   Ht 5\' 4"  (1.626 m)   Wt 108.9 kg   LMP 09/14/2023 (Approximate)   SpO2 98%   BMI 41.20 kg/m   Visual Acuity Right Eye Distance:   Left Eye Distance:   Bilateral Distance:    Right Eye Near:   Left Eye Near:    Bilateral Near:     Physical Exam Vitals reviewed.  Constitutional:      General: She is not in acute distress.    Appearance: She is not ill-appearing, toxic-appearing or diaphoretic.  HENT:     Right Ear: Tympanic membrane and ear canal normal.     Left Ear: Tympanic membrane and ear canal normal.     Nose: Nose normal.     Mouth/Throat:     Mouth: Mucous membranes are moist.     Pharynx: No oropharyngeal exudate or posterior oropharyngeal erythema.  Eyes:     Conjunctiva/sclera: Conjunctivae normal.     Pupils: Pupils are equal, round, and reactive to light.  Cardiovascular:     Rate and Rhythm: Normal rate and regular rhythm.     Heart sounds: No murmur heard. Pulmonary:     Effort: No respiratory distress.     Breath sounds: No stridor. No wheezing, rhonchi or rales.  Abdominal:     Palpations: Abdomen is soft.     Tenderness: There is no abdominal tenderness.  There is no right CVA tenderness or left CVA tenderness.  Skin:     Coloration: Skin is not jaundiced or pale.  Neurological:     General: No focal deficit present.     Mental Status: She is alert and oriented to person, place, and time.  Psychiatric:        Behavior: Behavior normal.      UC Treatments / Results  Labs (all labs ordered are listed, but only abnormal results are displayed) Labs Reviewed  POCT URINALYSIS DIP (MANUAL ENTRY) - Abnormal; Notable for the following components:      Result Value   Blood, UA trace-intact (*)    All other components within normal limits  URINE CULTURE    EKG   Radiology No results found.  Procedures Procedures (including critical care time)  Medications Ordered in UC Medications - No data to display  Initial Impression / Assessment and Plan / UC Course  I have reviewed the triage vital signs and the nursing notes.  Pertinent labs & imaging results that were available during my care of the patient were reviewed by me and considered in my medical decision making (see chart for details).     Urinalysis shows a trace of red blood cells.  Urine culture is sent.  Omnicef is sent in to treat possible UTI and possible acute sinusitis.  Staff will notify her if the urine culture show she needs different antibiotic  Prednisone  is sent in for asthma exacerbation and albuterol  inhaler is also sent in.  I have asked her to follow-up with primary care in case she ends up needing a daily controller medication for her asthma. Final Clinical Impressions(s) / UC Diagnoses   Final diagnoses:  Flank pain  Mild intermittent asthma with acute exacerbation     Discharge Instructions      The urinalysis showed a little bit of red blood cells.  Take cefdinir 300 mg--2 capsules together daily for 7 days  Urine culture is sent, and staff will notify you that it looks like the antibiotic needs to be changed.  Take prednisone  20 mg--2 daily for 5 days  Albuterol  inhaler--do 2 puffs every 4 hours as  needed for shortness of breath or wheezing  Zyrtec /cetirizine  10 mg tablet--take 1 daily as needed for allergy or itching.   Please follow-up with your primary care about the asthma issue.   ED Prescriptions     Medication Sig Dispense Auth. Provider   albuterol  (VENTOLIN  HFA) 108 (90 Base) MCG/ACT inhaler Inhale 2 puffs into the lungs every 4 (four) hours as needed for shortness of breath or wheezing. 1 each Ann Keto, MD   cefdinir (OMNICEF) 300 MG capsule Take 2 capsules (600 mg total) by mouth daily for 7 days. 14 capsule Cassia Fein K, MD   predniSONE  (DELTASONE ) 20 MG tablet Take 2 tablets (40 mg total) by mouth daily with breakfast for 5 days. 10 tablet Ann Keto, MD   cetirizine  (ZYRTEC  ALLERGY) 10 MG tablet Take 1 tablet (10 mg total) by mouth daily as needed for allergies. 30 tablet Gerrard Crystal K, MD      PDMP not reviewed this encounter.   Ann Keto, MD 10/09/23 1136

## 2023-10-10 LAB — URINE CULTURE: Culture: NO GROWTH

## 2023-10-12 ENCOUNTER — Ambulatory Visit (HOSPITAL_COMMUNITY): Payer: Self-pay

## 2023-10-28 ENCOUNTER — Ambulatory Visit (HOSPITAL_COMMUNITY)

## 2023-10-28 ENCOUNTER — Ambulatory Visit

## 2023-10-29 ENCOUNTER — Ambulatory Visit

## 2023-10-30 ENCOUNTER — Ambulatory Visit

## 2023-10-30 ENCOUNTER — Ambulatory Visit
Admission: RE | Admit: 2023-10-30 | Discharge: 2023-10-30 | Disposition: A | Discharge status: Home / Self Care | Source: Ambulatory Visit | Attending: Family Medicine | Admitting: Family Medicine

## 2023-10-30 VITALS — BP 139/91 | HR 71 | Temp 98.5°F | Resp 18

## 2023-10-30 DIAGNOSIS — L409 Psoriasis, unspecified: Secondary | ICD-10-CM | POA: Insufficient documentation

## 2023-10-30 DIAGNOSIS — T20212A Burn of second degree of left ear [any part, except ear drum], initial encounter: Secondary | ICD-10-CM | POA: Insufficient documentation

## 2023-10-30 DIAGNOSIS — B9689 Other specified bacterial agents as the cause of diseases classified elsewhere: Secondary | ICD-10-CM | POA: Insufficient documentation

## 2023-10-30 DIAGNOSIS — N760 Acute vaginitis: Secondary | ICD-10-CM | POA: Insufficient documentation

## 2023-10-30 DIAGNOSIS — T22211A Burn of second degree of right forearm, initial encounter: Secondary | ICD-10-CM | POA: Insufficient documentation

## 2023-10-30 LAB — POCT URINE PREGNANCY: Preg Test, Ur: NEGATIVE

## 2023-10-30 MED ORDER — SILVER SULFADIAZINE 1 % EX CREA: 1.0000 | TOPICAL_CREAM | Freq: Every day | CUTANEOUS | 0 refills | Status: DC

## 2023-10-30 MED ORDER — BACITRACIN ZINC 500 UNIT/GM EX OINT: 1.0000 | TOPICAL_OINTMENT | Freq: Two times a day (BID) | CUTANEOUS | 0 refills | Status: AC

## 2023-10-30 MED ORDER — PREDNISONE 20 MG PO TABS: ORAL_TABLET | ORAL | 0 refills | Status: DC

## 2023-10-30 MED ORDER — FLUCONAZOLE 150 MG PO TABS
150.0000 mg | ORAL_TABLET | ORAL | 0 refills | Status: DC
Start: 2023-10-30 — End: 2023-11-13

## 2023-10-30 MED ORDER — METRONIDAZOLE 0.75 % VA GEL
1.0000 | Freq: Every day | VAGINAL | 0 refills | Status: AC
Start: 2023-10-30 — End: ?

## 2023-10-30 NOTE — Discharge Instructions (Addendum)
 Please change your dressing 2-3 times daily. Apply silvadene cream generously to the right forearm and secure with a non-stick dressing. Each time you change your dressing, make sure you clean gently around the perimeter of the wound with gentle soap and warm water. Do not peel off any dead skin. If it comes off in the process of washing your wound or removing the dressing, that's okay. Pat your wound dry and let it air out if possible for an hour before reapplying another dressing.  Apply Bacitracin  ointment to the left ear and scalp. Follow up with wound care.   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  Follow-up with your PCP or dermatologist for recheck.  We have requested going ahead and treating you for BV and yeast infection given your symptoms and history.  Your test results will be available on Monday.  We will call and let you know if any changes are necessary.
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
## 2023-11-02 ENCOUNTER — Encounter: Admitting: Family

## 2023-11-02 ENCOUNTER — Telehealth: Payer: Self-pay | Admitting: Family

## 2023-11-02 NOTE — Telephone Encounter (Signed)
 Called patient to schedule hospital follow up from ED visit

## 2023-11-03 LAB — CERVICOVAGINAL ANCILLARY ONLY
Bacterial Vaginitis (gardnerella): NEGATIVE
Candida Glabrata: NEGATIVE
Candida Vaginitis: NEGATIVE
Chlamydia: NEGATIVE
Comment: NEGATIVE
Comment: NEGATIVE
Comment: NEGATIVE
Comment: NEGATIVE
Comment: NEGATIVE
Comment: NORMAL
Neisseria Gonorrhea: NEGATIVE
Trichomonas: NEGATIVE

## 2023-11-04 ENCOUNTER — Ambulatory Visit (HOSPITAL_COMMUNITY)

## 2023-11-04 ENCOUNTER — Ambulatory Visit

## 2023-11-13 ENCOUNTER — Encounter (HOSPITAL_COMMUNITY): Payer: Self-pay

## 2023-11-13 ENCOUNTER — Ambulatory Visit (HOSPITAL_COMMUNITY)
Admission: RE | Admit: 2023-11-13 | Discharge: 2023-11-13 | Disposition: A | Discharge status: Home / Self Care | Source: Ambulatory Visit | Attending: Emergency Medicine | Admitting: Emergency Medicine

## 2023-11-13 ENCOUNTER — Ambulatory Visit (INDEPENDENT_AMBULATORY_CARE_PROVIDER_SITE_OTHER)

## 2023-11-13 VITALS — BP 133/90 | HR 73 | Temp 98.2°F | Resp 17

## 2023-11-13 DIAGNOSIS — J4520 Mild intermittent asthma, uncomplicated: Secondary | ICD-10-CM

## 2023-11-13 DIAGNOSIS — R053 Chronic cough: Secondary | ICD-10-CM

## 2023-11-13 MED ORDER — ALBUTEROL SULFATE HFA 108 (90 BASE) MCG/ACT IN AERS: 2.0000 | INHALATION_SPRAY | Freq: Four times a day (QID) | RESPIRATORY_TRACT | 1 refills | Status: AC | PRN

## 2023-11-13 MED ORDER — CETIRIZINE HCL 10 MG PO TABS: 10.0000 mg | ORAL_TABLET | Freq: Every day | ORAL | 2 refills | Status: AC

## 2023-11-13 NOTE — ED Triage Notes (Addendum)
 Pt reports itching throat and cough always have it. Reports always have this cough. Reports coughing makes her want to vomit so had to leave work.

## 2023-11-13 NOTE — ED Provider Notes (Signed)
 [facility]    CSN: [id number] Arrival date & time: 11/13/23  1058      History   Chief Complaint Chief Complaint  Patient presents with   Appointment    HPI Paula [name] is a 28 y.o. female.  Cough for about a year. Worse over the last 2 months or so Reports itching throat and dryness to cough Sometimes cough makes her want to vomit No fever  She has history of asthma, uses Primatene inhaler OTC Also occasional marijuana smoker. Denies nicotine or tobacco use  Seen last month for cough. She was given an albuterol  inhaler and steroid burst. Was advised to follow up with primary care. 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   Back pain 03/27/2023   Neck pain 03/27/2023   Macromastia 08/31/2020   Morbid obesity (HCC) 12/17/2017   Menometrorrhagia 01/19/2014   General counseling for prescription of oral contraceptives 01/19/2014   MRSA (methicillin resistant staph aureus) culture positive 09/03/2011   Candida albicans infection 09/03/2011    Past Surgical History:  Procedure Laterality Date   GINGIVECTOMY  2010   TONSILECTOMY, ADENOIDECTOMY, BILATERAL MYRINGOTOMY AND TUBES      OB History     Gravida  0   Para  0   Term  0   Preterm  0   AB  0   Living  0      SAB  0   IAB  0   Ectopic  0   Multiple  0   Live Births               Home Medications    Prior to Admission medications   Medication Sig Start Date End Date Taking? Authorizing Provider  albuterol  (VENTOLIN  HFA) 108 (90 Base) MCG/ACT inhaler Inhale 2 puffs into the lungs every 6 (six) hours as needed for wheezing or shortness of breath. 11/13/23  Yes Yolanda Huffstetler,  [place], PA-C  cetirizine  (ZYRTEC  ALLERGY) 10 MG tablet Take 1 tablet (10 mg total) by mouth daily. 11/13/23  Yes Toddy Boyd, [place], PA-C  bacitracin  ointment Apply 1 Application topically 2 (two) times daily. 10/30/23   Christopher Savannah, PA-C  desonide (DESOWEN) 0.05 % cream Apply 1 Application topically 2 (two) times daily as needed.    [provider]  ketoconazole (NIZORAL) 2 % shampoo Apply 1 Application topically as directed. 08/04/23   [provider]  metroNIDAZOLE  (METROGEL ) 0.75 % vaginal gel Place 1 Applicatorful vaginally daily. Insert one applicator vaginally once daily. 10/30/23   Christopher Savannah, PA-C  nystatin  cream (MYCOSTATIN ) Apply 1 Application topically 2 (two) times daily as needed (psoriasis). Needs refill    [provider]    Family History Family History  Problem Relation Age of Onset   Diabetes Father    ADD / ADHD Sister    Hypertension Maternal Grandmother     Social History Social History   Tobacco Use   Smoking status: Never   Smokeless tobacco: Never  Vaping Use   Vaping status: Never Used  Substance Use Topics   Alcohol use: Yes    Comment: occasionally   Drug use: Yes    Types:  Marijuana     Allergies   Patient has no known allergies.   Review of Systems Review of Systems As per HPI  Physical Exam Triage Vital Signs ED Triage Vitals  Encounter Vitals Group     BP 11/13/23 1121 (!) 133/90     Girls Systolic BP Percentile --      Girls Diastolic BP Percentile --      Boys Systolic BP Percentile --      Boys Diastolic BP Percentile --      Pulse Rate 11/13/23 1121 73     Resp 11/13/23 1121 17     Temp 11/13/23 1121 98.2 F (36.8 C)     Temp Source 11/13/23 1121 Oral     SpO2 11/13/23 1121 96 %     Weight --      Height --      Head Circumference --      Peak Flow --      Pain Score 11/13/23 1119 0     Pain Loc --      Pain Education --      Exclude from Growth Chart --    No data found.  Updated Vital  Signs BP (!) 133/90 (BP Location: Left Arm)   Pulse 73   Temp 98.2 F (36.8 C) (Oral)   Resp 17   LMP 11/12/2023 (Exact Date)   SpO2 96%    Physical Exam Vitals and nursing note reviewed.  Constitutional:      General: She is not in acute distress.    Appearance: Normal appearance. She is not ill-appearing or diaphoretic.  HENT:     Nose: Nose normal.     Mouth/Throat:     Pharynx: Oropharynx is clear. No posterior oropharyngeal erythema.  Eyes:     Conjunctiva/sclera: Conjunctivae normal.  Cardiovascular:     Rate and Rhythm: Normal rate and regular rhythm.     Pulses: Normal pulses.     Heart sounds: Normal heart sounds.  Pulmonary:     Effort: Pulmonary effort is normal. No respiratory distress.     Breath sounds: Normal breath sounds. No wheezing, rhonchi or rales.  Musculoskeletal:     Cervical back: Normal range of motion. No rigidity or tenderness.  Lymphadenopathy:     Cervical: No cervical adenopathy.  Skin:    General: Skin is warm and dry.  Neurological:     Mental Status: She is alert and oriented to person, place, and time.      UC Treatments / Results  Labs (all labs ordered are listed, but only abnormal results are displayed) Labs Reviewed - No data to display  EKG   Radiology DG Chest 2 View Result Date: 11/13/2023 CLINICAL DATA:  Cough for 1 year. EXAM: CHEST - 2 VIEW COMPARISON:  February 28, 2022. FINDINGS: The heart size and mediastinal contours are within normal limits. Both lungs are clear. The visualized skeletal structures are unremarkable. IMPRESSION: No active cardiopulmonary disease. Electronically Signed   By: Lynwood Landy Raddle M.D.   On: 11/13/2023 12:44    Procedures Procedures (including critical care time)  Medications Ordered in UC Medications - No data to display  Initial Impression / Assessment and Plan / UC Course  I have reviewed the triage vital signs and the nursing notes.  Pertinent labs & imaging results that were  available during my care of the patient were reviewed by me and considered in my medical decision making (see chart for details).  Afebrile, well appearing,  stable vitals. Sating 96% room air  Clear lungs throughout Chest xray today unremarkable. Images independently reviewed by me, agree with radiology interpretation. Recommend starting daily allergy med, albuterol  inhaler twice daily, and contact her PCP to make a follow up visit as soon as able Agrees to plan, no questions  Final Clinical Impressions(s) / UC Diagnoses   Final diagnoses:  Chronic cough  Mild intermittent asthma without complication     Discharge Instructions      Your chest xray was normal. Your cough may be due to many things including uncontrolled asthma, allergies, smoking, and environmental exposures.   Please call your primary care provider first thing Monday morning to make a follow up appointment.  I recommend to start taking once daily zyrtec , every single day  Use your albuterol  inhaler twice daily, every single day. Use 2 puffs in the morning and 2 puffs at night.     ED Prescriptions     Medication Sig Dispense Auth. Provider   albuterol  (VENTOLIN  HFA) 108 (90 Base) MCG/ACT inhaler Inhale 2 puffs into the lungs every 6 (six) hours as needed for wheezing or shortness of breath. 8 g Bliss Tsang, PA-C   cetirizine  (ZYRTEC  ALLERGY) 10 MG tablet Take 1 tablet (10 mg total) by mouth daily. 30 tablet Jeff Frieden, [place], PA-C      PDMP not reviewed this encounter.   Brylea Pita, PA-C 11/13/23 1307

## 2023-11-13 NOTE — Discharge Instructions (Addendum)
 Your chest xray was normal. Your cough may be due to many things including uncontrolled asthma, allergies, smoking, and environmental exposures.   Please call your primary care provider first thing Monday morning to make a follow up appointment.  I recommend to start taking once daily zyrtec , every single day  Use your albuterol  inhaler twice daily, every single day. Use 2 puffs in the morning and 2 puffs at night.

## 2023-11-20 ENCOUNTER — Ambulatory Visit (HOSPITAL_COMMUNITY)

## 2024-01-22 ENCOUNTER — Ambulatory Visit
Admission: RE | Admit: 2024-01-22 | Discharge: 2024-01-22 | Disposition: A | Discharge status: Home / Self Care | Source: Ambulatory Visit | Attending: Physician Assistant | Admitting: Physician Assistant

## 2024-01-22 ENCOUNTER — Ambulatory Visit

## 2024-01-22 VITALS — BP 151/100 | HR 79 | Temp 98.2°F | Resp 18 | Wt 240.1 lb

## 2024-01-22 DIAGNOSIS — N898 Other specified noninflammatory disorders of vagina: Secondary | ICD-10-CM | POA: Diagnosis present

## 2024-01-22 DIAGNOSIS — Z113 Encounter for screening for infections with a predominantly sexual mode of transmission: Secondary | ICD-10-CM | POA: Insufficient documentation

## 2024-01-22 LAB — POCT URINE DIPSTICK
Bilirubin, UA: NEGATIVE
Blood, UA: NEGATIVE
Glucose, UA: NEGATIVE mg/dL
Ketones, POC UA: NEGATIVE mg/dL
Leukocytes, UA: NEGATIVE
Nitrite, UA: NEGATIVE
Protein Ur, POC: 30 mg/dL — AB
Spec Grav, UA: 1.02 (ref 1.010–1.025)
Urobilinogen, UA: 0.2 U/dL
pH, UA: 7 (ref 5.0–8.0)

## 2024-01-22 LAB — POCT URINE PREGNANCY: Preg Test, Ur: NEGATIVE

## 2024-01-22 NOTE — Discharge Instructions (Signed)
 Lab results available via mychart in 2 - 5 days
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
## 2024-01-22 NOTE — ED Provider Notes (Addendum)
 [name]    CSN: [id number] Arrival date & time: 01/22/24  1502      History   Chief Complaint Chief Complaint  Patient presents with   Vaginal Discharge    Entered by patient    HPI Paula [name] is a 28 y.o. female.   Patient here concerned with vaginal odor, fish like odor x several days. Slight discharge.  Denies any other sx.  Sexually active about 3 weeks ago.  She would also like HIV and RPR testing.    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   Back pain 03/27/2023   Neck pain 03/27/2023   Macromastia 08/31/2020   Morbid obesity (HCC) 12/17/2017   Menometrorrhagia 01/19/2014   General counseling for prescription of oral contraceptives 01/19/2014   MRSA (methicillin resistant staph aureus) culture positive 09/03/2011   Candida albicans infection 09/03/2011    Past Surgical History:  Procedure Laterality Date   GINGIVECTOMY  2010   TONSILECTOMY, ADENOIDECTOMY, BILATERAL MYRINGOTOMY AND TUBES      OB History     Gravida  0   Para  0   Term  0   Preterm  0   AB  0   Living  0      SAB  0   IAB  0   Ectopic  0   Multiple  0   Live Births               Home Medications    Prior to Admission medications   Medication Sig Start Date End Date Taking? Authorizing Provider  albuterol  (VENTOLIN  HFA) 108 (90 Base) MCG/ACT inhaler Inhale 2 puffs into the lungs every 6 (six) hours as needed for wheezing or shortness of breath. 11/13/23   Rising, Asberry, PA-C  bacitracin  ointment Apply 1 Application topically 2 (two) times daily. 10/30/23   Christopher Savannah, PA-C  cetirizine  (ZYRTEC  ALLERGY) 10 MG tablet Take 1 tablet (10 mg total) by mouth daily. 11/13/23   Rising, Asberry, PA-C   desonide (DESOWEN) 0.05 % cream Apply 1 Application topically 2 (two) times daily as needed.    [provider]  ketoconazole (NIZORAL) 2 % shampoo Apply 1 Application topically as directed. 08/04/23   [provider]  metroNIDAZOLE  (METROGEL ) 0.75 % vaginal gel Place 1 Applicatorful vaginally daily. Insert one applicator vaginally once daily. 10/30/23   Christopher Savannah, PA-C  nystatin  cream (MYCOSTATIN ) Apply 1 Application topically 2 (two) times daily as needed (psoriasis). Needs refill    [provider]    Family History Family History  Problem Relation Age of Onset   Diabetes Father    ADD / ADHD Sister    Hypertension Maternal Grandmother     Social History Social History   Tobacco Use   Smoking status: Never    Passive exposure: Never   Smokeless tobacco: Never  Vaping Use   Vaping status: Never Used  Substance Use Topics   Alcohol use: Yes    Comment: occasionally   Drug use: Yes    Types: Marijuana     Allergies   Patient has no known allergies.   Review of Systems Review of Systems  Constitutional:  Negative for chills, fatigue and  fever.  Gastrointestinal:  Negative for abdominal pain, nausea and vomiting.  Genitourinary:  Positive for vaginal discharge. Negative for difficulty urinating, dysuria, flank pain, frequency, genital sores, hematuria, urgency, vaginal bleeding and vaginal pain.  Musculoskeletal:  Negative for back pain.  Skin:  Negative for rash and wound.  Allergic/Immunologic: Negative for environmental allergies and immunocompromised state.  Neurological:  Negative for headaches.  Hematological:  Negative for adenopathy. Does not bruise/bleed easily.  Psychiatric/Behavioral:  Negative for sleep disturbance.      Physical Exam Triage Vital Signs ED Triage Vitals  Encounter Vitals Group     BP 01/22/24 1528 (!) 151/100     Girls Systolic BP Percentile --      Girls Diastolic BP Percentile --      Boys Systolic BP  Percentile --      Boys Diastolic BP Percentile --      Pulse Rate 01/22/24 1528 79     Resp 01/22/24 1528 18     Temp 01/22/24 1528 98.2 F (36.8 C)     Temp Source 01/22/24 1528 Oral     SpO2 01/22/24 1528 98 %     Weight 01/22/24 1527 240 lb 1.3 oz (108.9 kg)     Height --      Head Circumference --      Peak Flow --      Pain Score 01/22/24 1527 0     Pain Loc --      Pain Education --      Exclude from Growth Chart --    No data found.  Updated Vital Signs BP (!) 151/100 (BP Location: Right Arm)   Pulse 79   Temp 98.2 F (36.8 C) (Oral)   Resp 18   Wt 240 lb 1.3 oz (108.9 kg)   LMP 01/09/2024 (Exact Date)   SpO2 98%   BMI 41.21 kg/m   Visual Acuity Right Eye Distance:   Left Eye Distance:   Bilateral Distance:    Right Eye Near:   Left Eye Near:    Bilateral Near:     Physical Exam Vitals and nursing note reviewed.  Constitutional:      General: She is not in acute distress.    Appearance: Normal appearance. She is not ill-appearing.  HENT:     Head: Normocephalic and atraumatic.  Eyes:     General: No scleral icterus.    Extraocular Movements: Extraocular movements intact.     Conjunctiva/sclera: Conjunctivae normal.  Pulmonary:     Effort: Pulmonary effort is normal. No respiratory distress.  Musculoskeletal:        General: Normal range of motion.     Cervical back: Normal range of motion. No rigidity.  Skin:    General: Skin is warm.     Coloration: Skin is not jaundiced.     Findings: No rash.  Neurological:     General: No focal deficit present.     Mental Status: She is alert and oriented to person, place, and time.     Motor: No weakness.     Gait: Gait normal.  Psychiatric:        Mood and Affect: Mood normal.        Behavior: Behavior normal.      UC Treatments / Results  Labs (all labs ordered are listed, but only abnormal results are displayed) Labs Reviewed  POCT URINE DIPSTICK - Abnormal; Notable for the following  components:      Result Value   Clarity,  UA cloudy (*)    Protein Ur, POC =30 (*)    All other components within normal limits  POCT URINE PREGNANCY - Normal  HIV ANTIBODY (ROUTINE TESTING W REFLEX)  RPR  CERVICOVAGINAL ANCILLARY ONLY    EKG   Radiology No results found.  Procedures Procedures (including critical care time)  Medications Ordered in UC Medications - No data to display  Initial Impression / Assessment and Plan / UC Course  I have reviewed the triage vital signs and the nursing notes.  Pertinent labs & imaging results that were available during my care of the patient were reviewed by me and considered in my medical decision making (see chart for details).     Lab results available via mychart in 2 - 5 days, we will call with results and medication will be sent to pharmacy, as needed Final Clinical Impressions(s) / UC Diagnoses   Final diagnoses:  Screen for STD (sexually transmitted disease)  Vaginal discharge     Discharge Instructions      Lab results available via mychart in 2 - 5 days     ED Prescriptions   None    PDMP not reviewed this encounter.   Juleen Rush, PA-C 01/22/24 1609    Juleen Rush, PA-C 01/22/24 1641

## 2024-01-23 LAB — HIV ANTIBODY (ROUTINE TESTING W REFLEX): HIV Screen 4th Generation wRfx: NONREACTIVE

## 2024-01-23 LAB — RPR: RPR Ser Ql: NONREACTIVE

## 2024-01-25 ENCOUNTER — Ambulatory Visit (HOSPITAL_COMMUNITY): Payer: Self-pay

## 2024-01-25 LAB — CERVICOVAGINAL ANCILLARY ONLY
Bacterial Vaginitis (gardnerella): POSITIVE — AB
Candida Glabrata: NEGATIVE
Candida Vaginitis: NEGATIVE
Chlamydia: NEGATIVE
Comment: NEGATIVE
Comment: NEGATIVE
Comment: NEGATIVE
Comment: NEGATIVE
Comment: NEGATIVE
Comment: NORMAL
Neisseria Gonorrhea: NEGATIVE
Trichomonas: NEGATIVE

## 2024-01-25 MED ORDER — METRONIDAZOLE 500 MG PO TABS: 500.0000 mg | ORAL_TABLET | Freq: Two times a day (BID) | ORAL | 0 refills | Status: AC

## 2024-02-02 ENCOUNTER — Ambulatory Visit (INDEPENDENT_AMBULATORY_CARE_PROVIDER_SITE_OTHER): Admitting: Family

## 2024-02-02 ENCOUNTER — Encounter: Payer: Self-pay | Admitting: Family

## 2024-02-02 VITALS — BP 137/97 | HR 72 | Temp 97.6°F | Resp 16 | Ht 64.0 in | Wt 241.0 lb

## 2024-02-02 DIAGNOSIS — Z13228 Encounter for screening for other metabolic disorders: Secondary | ICD-10-CM

## 2024-02-02 DIAGNOSIS — F419 Anxiety disorder, unspecified: Secondary | ICD-10-CM

## 2024-02-02 DIAGNOSIS — Z0130 Encounter for examination of blood pressure without abnormal findings: Secondary | ICD-10-CM

## 2024-02-02 DIAGNOSIS — Z130 Encounter for screening for diseases of the blood and blood-forming organs and certain disorders involving the immune mechanism: Secondary | ICD-10-CM | POA: Diagnosis not present

## 2024-02-02 DIAGNOSIS — F32A Depression, unspecified: Secondary | ICD-10-CM

## 2024-02-02 DIAGNOSIS — Z131 Encounter for screening for diabetes mellitus: Secondary | ICD-10-CM

## 2024-02-02 DIAGNOSIS — Z139 Encounter for screening, unspecified: Secondary | ICD-10-CM

## 2024-02-02 DIAGNOSIS — Z0000 Encounter for general adult medical examination without abnormal findings: Secondary | ICD-10-CM | POA: Diagnosis not present

## 2024-02-02 DIAGNOSIS — Z13220 Encounter for screening for lipoid disorders: Secondary | ICD-10-CM

## 2024-02-02 DIAGNOSIS — Z124 Encounter for screening for malignant neoplasm of cervix: Secondary | ICD-10-CM

## 2024-02-02 DIAGNOSIS — Z1329 Encounter for screening for other suspected endocrine disorder: Secondary | ICD-10-CM

## 2024-02-02 MED ORDER — HYDROXYZINE PAMOATE 25 MG PO CAPS: 25.0000 mg | ORAL_CAPSULE | Freq: Three times a day (TID) | ORAL | 1 refills | Status: AC | PRN

## 2024-02-02 MED ORDER — ESCITALOPRAM OXALATE 5 MG PO TABS: 5.0000 mg | ORAL_TABLET | Freq: Every day | ORAL | 0 refills | Status: AC

## 2024-02-02 NOTE — Progress Notes (Signed)
 Patient scored a 17 on the GAD-7

## 2024-02-02 NOTE — Progress Notes (Signed)
 Patient ID: Paula [name], female    DOB: [birth year]  MRN: [id number]  CC: Annual Exam  Subjective: Paula [name] is a 28 y.o. female who presents for annual exam.  Her concerns today include:  - Due for cervical cancer screening.  - Anxiety depression. Doing well on Escitalopram  and Hydroxyzine , no issues/concerns. Requests referral to therapist. She denies thoughts of self-harm, suicidal ideations, homicidal ideations. - Blood pressure check. She does not complain of red flag symptoms such as but not limited to chest pain, shortness of breath, worst headache of life, nausea/vomiting.   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 8 g 1   bacitracin  ointment Apply 1 Application topically 2 (two) times daily. 120 g 0   cetirizine  (ZYRTEC  ALLERGY) 10 MG tablet Take 1 tablet (10 mg total) by mouth daily. 30 tablet 2   desonide (DESOWEN) 0.05 % cream Apply 1 Application topically 2 (two) times daily as needed.     ketoconazole (NIZORAL) 2 % shampoo Apply 1 Application topically as directed.     metroNIDAZOLE  (FLAGYL ) 500 MG tablet Take 1 tablet (500 mg total) by mouth 2 (two) times daily. 14 tablet 0   metroNIDAZOLE  (METROGEL ) 0.75 % vaginal gel Place 1 Applicatorful vaginally daily. Insert one applicator vaginally once  daily. 70 g 0   nystatin  cream (MYCOSTATIN ) Apply 1 Application topically 2 (two) times daily as needed (psoriasis). Needs refill     No current facility-administered medications on file prior to visit.    No Known Allergies  Social History   Socioeconomic History   Marital status: Single    Spouse name: Not on file   Number of children: Not on file   Years of education: Not on file   Highest education level: Not on file  Occupational History   Not on file  Tobacco Use   Smoking status: Never    Passive exposure: Never   Smokeless tobacco: Never  Vaping Use   Vaping status: Never Used  Substance and Sexual Activity   Alcohol use: Yes    Comment: occasionally   Drug use: Yes    Types: Marijuana   Sexual activity: Yes    Birth control/protection: Condom, None  Other Topics Concern   Not on file  Social History Narrative   Not on file   Social Drivers of Health   Financial Resource Strain: Low Risk  (02/02/2024)   Overall Financial Resource Strain (CARDIA)    Difficulty of Paying Living Expenses: Not very hard  Food Insecurity: No Food Insecurity (02/02/2024)   Hunger Vital Sign    Worried About Running Out of Food in the Last Year: Never true    Ran Out of Food in the Last Year: Never true  Transportation Needs: No Transportation Needs (02/02/2024)   PRAPARE - Transportation    Lack of  Transportation (Medical): No    Lack of Transportation (Non-Medical): No  Physical Activity: Insufficiently Active (02/02/2024)   Exercise Vital Sign    Days of Exercise per Week: 4 days    Minutes of Exercise per Session: 30 min  Stress: Stress Concern Present (02/02/2024)   [employer] of Occupational Health - Occupational Stress Questionnaire    Feeling of Stress: Very much  Social Connections: Moderately Isolated (02/02/2024)   Social Connection and Isolation Panel    Frequency of Communication with Friends and Family: More than three times a week    Frequency of Social  Gatherings with Friends and Family: Twice a week    Attends Religious Services: 1 to 4 times per year    Active Member of [employer] or Organizations: No    Attends Banker Meetings: Never    Marital Status: Never married  Intimate Partner Violence: Not At Risk (02/02/2024)   Humiliation, Afraid, Rape, and Kick questionnaire    Fear of Current or Ex-Partner: No    Emotionally Abused: No    Physically Abused: No    Sexually Abused: No    Family History  Problem Relation Age of Onset   Diabetes Father    ADD / ADHD Sister    Hypertension Maternal Grandmother     Past Surgical History:  Procedure Laterality Date   GINGIVECTOMY  2010   TONSILECTOMY, ADENOIDECTOMY, BILATERAL MYRINGOTOMY AND TUBES      ROS: Review of Systems Negative except as stated above  PHYSICAL EXAM: BP (!) 137/97   Pulse 72   Temp 97.6 F (36.4 C) (Oral)   Resp 16   Ht 5' 4 (1.626 m)   Wt 241 lb (109.3 kg)   LMP 01/26/2024 (Exact Date)   SpO2 95%   BMI 41.37 kg/m   Physical Exam HENT:     Head: Normocephalic and atraumatic.     Right Ear: Tympanic membrane, ear canal and external ear normal.     Left Ear: Tympanic membrane, ear canal and external ear normal.     Nose: Nose normal.     Mouth/Throat:     Mouth: Mucous membranes are moist.     Pharynx: Oropharynx is clear.  Eyes:     Extraocular Movements: Extraocular movements intact.     Conjunctiva/sclera: Conjunctivae normal.     Pupils: Pupils are equal, round, and reactive to light.  Neck:     Thyroid: No thyroid mass, thyromegaly or thyroid tenderness.  Cardiovascular:     Rate and Rhythm: Normal rate and regular rhythm.     Pulses: Normal pulses.     Heart sounds: Normal heart sounds.  Pulmonary:     Effort: Pulmonary effort is normal.     Breath sounds: Normal breath sounds.  Chest:     Comments: Patient declined. Abdominal:     General: Bowel sounds are normal.     Palpations: Abdomen is soft.  Genitourinary:     Comments: Patient declined. Musculoskeletal:        General: Normal range of motion.     Right shoulder: Normal.     Left shoulder: Normal.     Right upper arm: Normal.     Left upper arm: Normal.     Right elbow: Normal.     Left elbow: Normal.     Right forearm: Normal.     Left forearm: Normal.     Right wrist: Normal.     Left wrist: Normal.     Right  hand: Normal.     Left hand: Normal.     Cervical back: Normal, normal range of motion and neck supple.     Thoracic back: Normal.     Lumbar back: Normal.     Right hip: Normal.     Left hip: Normal.     Right upper leg: Normal.     Left upper leg: Normal.     Right knee: Normal.     Left knee: Normal.     Right lower leg: Normal.     Left lower leg: Normal.     Right ankle: Normal.     Left ankle: Normal.     Right foot: Normal.     Left foot: Normal.  Skin:    General: Skin is warm and dry.     Capillary Refill: Capillary refill takes less than 2 seconds.  Neurological:     General: No focal deficit present.     Mental Status: She is alert and oriented to person, place, and time.  Psychiatric:        Mood and Affect: Mood normal.        Behavior: Behavior normal.    ASSESSMENT AND PLAN: 1. Annual physical exam (Primary) - Counseled on 150 minutes of exercise per week as tolerated, healthy eating (including decreased daily intake of saturated fats, cholesterol, added sugars, sodium), STI prevention, and routine healthcare maintenance.  2. Screening for metabolic disorder - Routine screening.  - CMP14+EGFR  3. Screening for deficiency anemia - Routine screening.  - CBC  4. Diabetes mellitus screening - Routine screening.  - Hemoglobin A1c  5. Screening cholesterol level - Routine screening.  - Lipid panel  6. Thyroid disorder screen - Routine screening.  - TSH  7. Cervical cancer screening - Referral to Gynecology for evaluation/management.  - Ambulatory referral to Gynecology  8. Anxiety and  depression - Patient denies thoughts of self-harm, suicidal ideations, homicidal ideations. - Escitalopram  and Hydroxyzine  as prescribed. Counseled on medication adherence/adverse effects.  - Referral to Psychiatry for evaluation/management.  - Follow-up with primary provider as scheduled.  - escitalopram  (LEXAPRO ) 5 MG tablet; Take 1 tablet (5 mg total) by mouth daily.  Dispense: 90 tablet; Refill: 0 - hydrOXYzine  (VISTARIL ) 25 MG capsule; Take 1 capsule (25 mg total) by mouth every 8 (eight) hours as needed.  Dispense: 90 capsule; Refill: 1 - Ambulatory referral to Psychiatry  9. Blood pressure check - Blood pressure not at goal during today's visit. Patient asymptomatic without chest pressure, chest pain, palpitations, shortness of breath, worst headache of life, and any additional red flag symptoms. - Follow-up with primary provider in 4 weeks or sooner if needed.  10. Encounter for screening involving social determinants of health (SDoH) - Referral to [facility] Care Management for community resources.  - AMB Referral [facility]I Care Management   Patient was given the opportunity to ask questions.  Patient verbalized understanding of the plan and was able to repeat key elements of the plan. Patient was given clear instructions to go to Emergency Department or return to medical center if symptoms don't improve, worsen, or new problems develop.The patient verbalized understanding.   Orders Placed This Encounter  Procedures   CBC   Lipid panel   CMP14+EGFR   Hemoglobin A1c   TSH   AMB Referral VBCI Care Management   Ambulatory referral to Gynecology   Ambulatory referral to Psychiatry     Requested Prescriptions   Signed Prescriptions Disp Refills   escitalopram  (LEXAPRO )  5 MG tablet 90 tablet 0    Sig: Take 1 tablet (5 mg total) by mouth daily.   hydrOXYzine  (VISTARIL ) 25 MG capsule 90 capsule 1    Sig: Take 1 capsule (25 mg total) by mouth every 8 (eight) hours as needed.    Return  in about 1 year (around 02/01/2025) for Physical per patient preference and 4 weeks blood pressure check.  Greig JINNY Drones, NP

## 2024-02-03 ENCOUNTER — Telehealth: Payer: Self-pay | Admitting: *Deleted

## 2024-02-03 ENCOUNTER — Ambulatory Visit: Payer: Self-pay | Admitting: Family

## 2024-02-03 DIAGNOSIS — Z13220 Encounter for screening for lipoid disorders: Secondary | ICD-10-CM

## 2024-02-03 LAB — CMP14+EGFR
ALT: 13 IU/L (ref 0–32)
AST: 14 IU/L (ref 0–40)
Albumin: 4.6 g/dL (ref 4.0–5.0)
Alkaline Phosphatase: 64 IU/L (ref 41–116)
BUN/Creatinine Ratio: 13 (ref 9–23)
BUN: 11 mg/dL (ref 6–20)
Bilirubin Total: 0.4 mg/dL (ref 0.0–1.2)
CO2: 20 mmol/L (ref 20–29)
Calcium: 9.2 mg/dL (ref 8.7–10.2)
Chloride: 102 mmol/L (ref 96–106)
Creatinine, Ser: 0.84 mg/dL (ref 0.57–1.00)
Globulin, Total: 2.2 g/dL (ref 1.5–4.5)
Glucose: 94 mg/dL (ref 70–99)
Potassium: 3.8 mmol/L (ref 3.5–5.2)
Sodium: 139 mmol/L (ref 134–144)
Total Protein: 6.8 g/dL (ref 6.0–8.5)
eGFR: 98 mL/min/1.73 (ref >59)

## 2024-02-03 LAB — CBC
Hematocrit: 40 % (ref 34.0–46.6)
Hemoglobin: 13.3 g/dL (ref 11.1–15.9)
MCH: 28.9 pg (ref 26.6–33.0)
MCHC: 33.3 g/dL (ref 31.5–35.7)
MCV: 87 fL (ref 79–97)
Platelets: 309 x10E3/uL (ref 150–450)
RBC: 4.61 x10E6/uL (ref 3.77–5.28)
RDW: 13.4 % (ref 11.7–15.4)
WBC: 5.1 x10E3/uL (ref 3.4–10.8)

## 2024-02-03 LAB — TSH: TSH: 0.986 u[IU]/mL (ref 0.450–4.500)

## 2024-02-03 LAB — LIPID PANEL
Chol/HDL Ratio: 4.1 ratio (ref 0.0–4.4)
Cholesterol, Total: 203 mg/dL — ABNORMAL HIGH (ref 100–199)
HDL: 49 mg/dL (ref >39)
LDL Chol Calc (NIH): 138 mg/dL — ABNORMAL HIGH (ref 0–99)
Triglycerides: 90 mg/dL (ref 0–149)
VLDL Cholesterol Cal: 16 mg/dL (ref 5–40)

## 2024-02-03 LAB — HEMOGLOBIN A1C
Est. average glucose Bld gHb Est-mCnc: 114 mg/dL
Hgb A1c MFr Bld: 5.6 % (ref 4.8–5.6)

## 2024-02-03 NOTE — Progress Notes (Signed)
 Complex Care Management Note Care Guide Note  02/03/2024 Name: Paula [name] MRN: [id number] DOB: 1995/05/27   Complex Care Management Outreach Attempts: An unsuccessful telephone outreach was attempted today to offer the patient information about available complex care management services.  Follow Up Plan:  Additional outreach attempts will be made to offer the patient complex care management information and services.   Encounter Outcome:  Patient Request to Call Back Harlene Satterfield  [facility]  [facility], [facility] Guide  Direct Dial: [phone]  Fax [phone]

## 2024-02-09 NOTE — Progress Notes (Unsigned)
 Complex Care Management Note Care Guide Note  02/09/2024 Name: CORAL TIMME MRN: [id number] DOB: 08-15-1995   Complex Care Management Outreach Attempts: A second unsuccessful outreach was attempted today to offer the patient with information about available complex care management services.  Follow Up Plan:  Additional outreach attempts will be made to offer the patient complex care management information and services.   Encounter Outcome:  Patient Request to Call Back  Harlene Satterfield  [facility]  [facility], [facility] Guide  Direct Dial: [phone]  Fax [phone]

## 2024-02-11 NOTE — Progress Notes (Signed)
 Complex Care Management Note  Care Guide Note 02/11/2024 Name: Paula [name] MRN: [id number] DOB: 1996/04/04  Paula [name] is a 28 y.o. year old female who sees Lorren Greig PARAS, NP for primary care. I reached out to Paula [name] by phone today to offer complex care management services.  Paula [name] was given information about Complex Care Management services today including:   The Complex Care Management services include support from the care team which includes your Nurse Care Manager, Clinical Social Worker, or Pharmacist.  The Complex Care Management team is here to help remove barriers to the health concerns and goals most important to you. Complex Care Management services are voluntary, and the patient may decline or stop services at any time by request to their care team member.   Complex Care Management Consent Status: Patient agreed to services and verbal consent obtained.   Follow up plan:  Telephone appointment with complex care management team member scheduled for:  03/01/24  Encounter Outcome:  Patient Scheduled  Harlene Satterfield  [facility]  [facility], [facility] Guide  Direct Dial: [phone]  Fax [phone]

## 2024-03-01 ENCOUNTER — Other Ambulatory Visit: Payer: Self-pay

## 2024-03-01 ENCOUNTER — Other Ambulatory Visit: Payer: Self-pay | Admitting: Licensed Clinical Social Worker

## 2024-03-01 NOTE — Patient Instructions (Signed)
 Visit Information  Thank you for taking time to visit with me today. Please don't hesitate to contact me if I can be of assistance to you before our next scheduled appointment.  Our next appointment is by telephone on 04/11/2024 at 1:00 PM    Please call the care guide team at [phone] if you need to cancel or reschedule your appointment.   Following is a copy of your care plan:   Goals Addressed             This Visit's Progress    VBCI Social Work Care Plan       Problems:   Anxiety issues             Grief issues (Death of her mother in 04-06-21)             Financial challenges. Utility cut off notice has been received             Depression issues                                     CSW Clinical Goal(s):   Over the next 30  days the Patient will attend all scheduled medical appointments as evidenced by patient report and care team review of appointment completion in electronic medical record.   Client will contact community agencies of choice in next 30 days to seek help with utility bill payments or other financial assistance program support  AEB patient report of contacting community agencies for possible help   Interventions:              Spoke with client about her current needs and status            Discussed pain issues of client. Discussed transport needs of client. Discussed appetite of client.           Discussed medication procurement for client. She said she has [place] Medicaid as her insurance.  She has her current prescribed medications and is taking prescribed medications as scheduled.              Completed assessments as needed. Completed GAD-7 . Completed PHQ 2/9             Discussed grief issues faced. Encouraged client to consider contacting [facility] to discuss possible counseling support services. She said she was familiar with [facility]             Discussed her job. She said she works part time for [employer]             LCSW suggested that she  might consider calling [employer] to talk with [employer] about grief support (since she is employee with [employer])              Discussed support from her grandmother.              She said she had a utility cut off notice. She has been going to several community agencies trying to seek help with utility bill payment                Client and LCSW spoke of financial stressors for client              Discussed program support with RN, Pharmacist, LCSW              Provided counseling support.  Client and LCSW discussed client mood. She said she feels that medications prescribed for her were helping her with her mood.                 Used Active Listening techniques to allow client to share her concerns and needs at this time                Encouraged client to call LCSW as needed at [phone]             Patient Goals/Self-Care Activities:  Attend scheduled appointments with Greig JINNY Drones NP             Call LCSW as needed for SW support              Consider calling [facility] for possible grief support for client              Allow time for rest and relaxation               Contact community agencies of choice to seek help with utility bill payment or other financial needs faced  Plan:   Telephone follow up appointment with care management team member scheduled for:  04/11/24 at 1:00 PM         Please go to [facility] [address] [place] [phone]) if you are experiencing a Mental Health or Behavioral Health Crisis or need someone to talk to.  Patient verbalized understanding of Care plan and visit instructions communicated this visit   Glendia Pear  MSW, LCSW [place]/Value Based Care [facility] Licensed Clinical Social Worker Direct Dial:  [phone] Fax:  [phone] Website:  [email or web address]

## 2024-03-01 NOTE — Patient Outreach (Signed)
 Complex Care Management   Visit Note  03/01/2024  Name:  Paula [name] MRN: [id number] DOB: 01/11/1996  Situation: Referral received for Complex Care Management related to anxiety and stress related to financial needs faced I obtained verbal consent from Patient.  Visit completed with Patient  on the phone  Background:   Past Medical History:  Diagnosis Date   Asthma    Depression    Diabetes mellitus without complication (HCC)    Psoriasis     Assessment: Patient Reported Symptoms:  Cognitive Cognitive Status: Able to follow simple commands, Alert and oriented to person, place, and time Cognitive/Intellectual Conditions Management [RPT]: None reported or documented in medical history or problem list   Health Facilitated by: Stress management  Neurological Neurological Review of Symptoms: Weakness Neurological Management Strategies: Adequate rest, Coping strategies  HEENT HEENT Symptoms Reported: No symptoms reported HEENT Management Strategies: Coping strategies    Cardiovascular Cardiovascular Symptoms Reported: Fatigue Cardiovascular Management Strategies: Adequate rest  Respiratory Respiratory Symptoms Reported: No symptoms reported Respiratory Management Strategies: Coping strategies  Endocrine Endocrine Symptoms Reported: Weakness or fatigue    Gastrointestinal Gastrointestinal Symptoms Reported: No symptoms reported Gastrointestinal Management Strategies: Coping strategies    Genitourinary Genitourinary Symptoms Reported: No symptoms reported Genitourinary Management Strategies: Coping strategies  Integumentary Integumentary Symptoms Reported: Skin changes Additional Integumentary Details: Dermatitis Skin Management Strategies: Coping strategies  Musculoskeletal Musculoskelatal Symptoms Reviewed: Weakness Musculoskeletal Management Strategies: Coping strategies      Psychosocial Psychosocial Symptoms Reported: Sadness - if selected complete PHQ 2-9, Depression -  if selected complete PHQ 2-9, Anxiety - if selected complete GAD Behavioral Management Strategies: Coping strategies Major Change/Loss/Stressor/Fears (CP): Medical condition, self Techniques to Cope with Loss/Stress/Change: Counseling Quality of Family Relationships: supportive Do you feel physically threatened by others?: No Financial stress. Has received utility cut off notice. Difficulty paying some of her current bills     03/01/2024    PHQ2-9 Depression Screening   Little interest or pleasure in doing things Several days  Feeling down, depressed, or hopeless Several days  PHQ-2 - Total Score 2  Trouble falling or staying asleep, or sleeping too much Not at all  Feeling tired or having little energy More than half the days  Poor appetite or overeating  Several days  Feeling bad about yourself - or that you are a failure or have let yourself or your family down Several days  Trouble concentrating on things, such as reading the newspaper or watching television Several days  Moving or speaking so slowly that other people could have noticed.  Or the opposite - being so fidgety or restless that you have been moving around a lot more than usual Several days  Thoughts that you would be better off dead, or hurting yourself in some way Not at all  PHQ2-9 Total Score 8  If you checked off any problems, how difficult have these problems made it for you to do your work, take care of things at home, or get along with other people Somewhat difficult  Depression Interventions/Treatment Medication, Counseling    Vitals:   No BP problems mentioned by client during client call with LCSW today Medications Reviewed Today     Reviewed by Frances Ozell GORMAN KEN (Social Worker) on 03/01/24 at 1136  Med List Status: <None>   Medication Order Taking? Sig Documenting Provider Last Dose Status Informant  albuterol  (VENTOLIN  HFA) 108 (90 Base) MCG/ACT inhaler [id number] Yes Inhale 2 puffs into the lungs  every 6 (six) hours as  needed for wheezing or shortness of breath. Rising, Asberry RIGGERS  Active   bacitracin  ointment [id number] Yes Apply 1 Application topically 2 (two) times daily. Christopher Savannah, PA-C  Active   cetirizine  (ZYRTEC  ALLERGY) 10 MG tablet [id number] Yes Take 1 tablet (10 mg total) by mouth daily. Rising, Asberry, PA-C  Active   desonide (DESOWEN) 0.05 % cream [id number] Yes Apply 1 Application topically 2 (two) times daily as needed. [provider]  Active   escitalopram  (LEXAPRO ) 5 MG tablet [id number] Yes Take 1 tablet (5 mg total) by mouth daily. Lorren Greig PARAS, NP  Active   hydrOXYzine  (VISTARIL ) 25 MG capsule [id number] Yes Take 1 capsule (25 mg total) by mouth every 8 (eight) hours as needed. Lorren Greig PARAS, NP  Active   ketoconazole (NIZORAL) 2 % shampoo [id number] Yes Apply 1 Application topically as directed. [provider]  Active   metroNIDAZOLE  (FLAGYL ) 500 MG tablet [id number] Not taking  Take 1 tablet (500 mg total) by mouth 2 (two) times daily.  Patient not taking: Reported on 03/01/2024   Banister, Pamela K, MD  Active   metroNIDAZOLE  (METROGEL ) 0.75 % vaginal gel [id number] Yes Place 1 Applicatorful vaginally daily. Insert one applicator vaginally once daily. Christopher Savannah, PA-C  Active   nystatin  cream (MYCOSTATIN ) [id number] Yes Apply 1 Application topically 2 (two) times daily as needed (psoriasis). Needs refill [provider]  Active             Recommendation:   PCP Follow-up Continue Current Plan of Care Consider calling [facility] for possible grief counseling support service Call LCSW as needed for SW support Allow time for rest and relaxation Contact community agencies of choice to seek financial help in paying utility bill or in paying other current bills faced.  Follow Up Plan:   Telephone follow up appointment date/time:  04/11/24 at 1:00 PM    Glendia Pear  MSW, LCSW [place]/Value Based Care [facility] Licensed Clinical Social Worker Direct Dial:  [phone] Fax:  [phone] Website:  [email or web address]

## 2024-03-14 ENCOUNTER — Ambulatory Visit (HOSPITAL_COMMUNITY)

## 2024-03-16 ENCOUNTER — Encounter: Admitting: Family

## 2024-03-16 NOTE — Progress Notes (Signed)
 Erroneous encounter-disregard

## 2024-04-11 ENCOUNTER — Other Ambulatory Visit: Payer: Self-pay | Admitting: Licensed Clinical Social Worker

## 2024-04-11 NOTE — Patient Outreach (Signed)
 Complex Care Management   Visit Note  04/11/2024  Name:  Paula [name] MRN: [id number] DOB: Jun 09, 1995  Situation: Referral received for Complex Care Management related to grief issues  I obtained verbal consent from Patient.  Visit completed with Patient  on the phone  Background:   Past Medical History:  Diagnosis Date   Asthma    Depression    Diabetes mellitus without complication (HCC)    Psoriasis     Assessment: Patient Reported Symptoms:  Cognitive Cognitive Status: Able to follow simple commands, Alert and oriented to person, place, and time Cognitive/Intellectual Conditions Management [RPT]: None reported or documented in medical history or problem list   Health Maintenance Behaviors: Stress management Health Facilitated by: Stress management  Neurological Neurological Review of Symptoms: Weakness Neurological Management Strategies: Adequate rest, Coping strategies  HEENT HEENT Symptoms Reported: No symptoms reported HEENT Management Strategies: Coping strategies    Cardiovascular Cardiovascular Symptoms Reported: Fatigue Cardiovascular Management Strategies: Adequate rest  Respiratory Respiratory Symptoms Reported: No symptoms reported Respiratory Management Strategies: Coping strategies  Endocrine Endocrine Symptoms Reported: Weakness or fatigue    Gastrointestinal Gastrointestinal Symptoms Reported: No symptoms reported Gastrointestinal Management Strategies: Coping strategies    Genitourinary Genitourinary Symptoms Reported: No symptoms reported Genitourinary Management Strategies: Coping strategies  Integumentary Integumentary Symptoms Reported: Skin changes Additional Integumentary Details: dermatitis Skin Management Strategies: Coping strategies  Musculoskeletal Musculoskelatal Symptoms Reviewed: Weakness Musculoskeletal Management Strategies: Coping strategies      Psychosocial Psychosocial Symptoms Reported: Sadness - if selected complete PHQ 2-9,  Depression - if selected complete PHQ 2-9 Additional Psychological Details: grief issues experienced Behavioral Management Strategies: Coping strategies, Counseling Major Change/Loss/Stressor/Fears (CP): Medical condition, self, Death of a loved one Techniques to Cope with Loss/Stress/Change: Counseling, Diversional activities Quality of Family Relationships: supportive Do you feel physically threatened by others?: No    04/11/2024    PHQ2-9 Depression Screening   Little interest or pleasure in doing things Several days  Feeling down, depressed, or hopeless Several days  PHQ-2 - Total Score 2  Trouble falling or staying asleep, or sleeping too much Several days  Feeling tired or having little energy More than half the days  Poor appetite or overeating  Several days  Feeling bad about yourself - or that you are a failure or have let yourself or your family down Several days  Trouble concentrating on things, such as reading the newspaper or watching television Several days  Moving or speaking so slowly that other people could have noticed.  Or the opposite - being so fidgety or restless that you have been moving around a lot more than usual Several days  Thoughts that you would be better off dead, or hurting yourself in some way Not at all  PHQ2-9 Total Score 9  If you checked off any problems, how difficult have these problems made it for you to do your work, take care of things at home, or get along with other people Somewhat difficult  Depression Interventions/Treatment Medication, Counseling    There were no vitals filed for this visit. Client did not mention any BP problems during call today with LCSW  Pain Scale: 0-10 Pain Score: 7  Pain Type: Acute pain Pain Location: Back Pain Orientation: Left Pain Descriptors / Indicators: Aching Pain Onset: Sudden Patients Stated Pain Goal: 1 Pain Intervention(s): Relaxation  Medications Reviewed Today   Medications were not reviewed  in this encounter     Recommendation:   PCP Follow-up Continue Current Plan of Care Call  LCSW as needed for SW support Consider calling [facility] with [place] for possible counseling support Consider calling [facility] for possible bereavement support  Follow Up Plan:   Telephone follow up appointment date/time:  05/23/2024 at 9:30 AM   Glendia Pear  MSW, LCSW /Value Based Care [facility] Licensed Clinical Social Worker Direct Dial:  [phone] Fax:  [phone] Website:  [email or web address]

## 2024-04-11 NOTE — Patient Instructions (Signed)
 Visit Information  Thank you for taking time to visit with me today. Please don't hesitate to contact me if I can be of assistance to you before our next scheduled appointment.  Our next appointment is by telephone on 05/23/2024 at 9:30 AM   Please call the care guide team at [phone] if you need to cancel or reschedule your appointment.   Following is a copy of your care plan:   Goals Addressed             This Visit's Progress    VBCI Social Work Care Plan       Problems:   Anxiety issues             Grief issues (Death of her mother in Apr 28, 2021)             Financial challenges. Utility cut off notice has been received             Depression issues                                     CSW Clinical Goal(s):   Over the next 30  days the Patient will attend all scheduled medical appointments as evidenced by patient report and care team review of appointment completion in electronic medical record.   Client will contact community agencies of choice in next 30 days to seek help with utility bill payments or other financial assistance program support  AEB patient report of contacting community agencies for possible help   Interventions:              Spoke with client about her current needs and status                     Discussed medication procurement for client previously. She said she has [place] [employer] as her insurance.  She has her current prescribed medications and is taking prescribed medications as scheduled.              Completed assessments as needed. Completed GAD-7 . Completed PHQ 2/9             Previously discussed grief issues faced. Encouraged client to consider contacting [facility] to discuss possible counseling support services. She said she was familiar with [facility]             Discussed her job. She said she works part time for [employer]             LCSW suggested that she might consider calling [facility] to talk with [facility] about grief support (since she is  employee with [employer])              She said she had a utility cut off notice. She has been going to several community agencies trying to seek help with utility bill payment                Discussed program support with RN, Pharmacist, LCSW               Client and LCSW discussed client mood previously . She said she feels that medications prescribed for her were helping her with her mood.                 Encouraged client to call LCSW as needed at [phone]  Patient Goals/Self-Care Activities:  Attend scheduled appointments with NP              Call LCSW as needed for SW support              Consider calling [facility] for possible grief support for client               Consider calling [facility] with [place] to discuss possible counseling support since she is employee of [employer].               Allow time for rest and relaxation               Contact community agencies of choice to seek help with utility bill payment or other financial needs faced  Plan:   Telephone follow up appointment with care management team member scheduled for:  05/23/2024 at 9:30 AM         Please go to [facility] [address], [place] [phone]) if you are experiencing a Mental Health or Behavioral Health Crisis or need someone to talk to.  Patient verbalized understanding of Care plan and visit instructions communicated this visit   Glendia Pear  MSW, LCSW [place]/Value Based Care [facility] Licensed Clinical Social Worker Direct Dial:  [phone] Fax:  [phone] Website:  [email or web address]

## 2024-04-22 ENCOUNTER — Encounter: Admitting: Family

## 2024-04-22 NOTE — Progress Notes (Signed)
 Erroneous encounter-disregard

## 2024-05-07 ENCOUNTER — Ambulatory Visit (HOSPITAL_COMMUNITY)

## 2024-05-23 ENCOUNTER — Other Ambulatory Visit: Payer: Self-pay | Admitting: Licensed Clinical Social Worker
